# Patient Record
Sex: Male | Born: 1985 | Race: White | Hispanic: No | Marital: Married | Smoking: Never smoker
Health system: Southern US, Community
[De-identification: ages and names within clinical notes are randomized; demographics above are authoritative.]

## PROBLEM LIST (undated history)

## (undated) HISTORY — PX: SKIN SURGERY: SHX2413

## (undated) HISTORY — PX: WISDOM TOOTH EXTRACTION: SHX21

---

## 2020-07-06 ENCOUNTER — Ambulatory Visit (INDEPENDENT_AMBULATORY_CARE_PROVIDER_SITE_OTHER): Payer: No Typology Code available for payment source | Admitting: Internal Medicine

## 2020-07-06 ENCOUNTER — Other Ambulatory Visit: Payer: Self-pay

## 2020-07-06 ENCOUNTER — Encounter: Payer: Self-pay | Admitting: Internal Medicine

## 2020-07-06 ENCOUNTER — Ambulatory Visit (INDEPENDENT_AMBULATORY_CARE_PROVIDER_SITE_OTHER): Payer: No Typology Code available for payment source

## 2020-07-06 VITALS — BP 120/78 | HR 79 | Temp 98.6°F | Ht 70.0 in | Wt 173.0 lb

## 2020-07-06 DIAGNOSIS — M25532 Pain in left wrist: Secondary | ICD-10-CM

## 2020-07-06 DIAGNOSIS — Z0001 Encounter for general adult medical examination with abnormal findings: Secondary | ICD-10-CM | POA: Diagnosis not present

## 2020-07-06 DIAGNOSIS — G8929 Other chronic pain: Secondary | ICD-10-CM | POA: Insufficient documentation

## 2020-07-06 DIAGNOSIS — R0683 Snoring: Secondary | ICD-10-CM | POA: Diagnosis not present

## 2020-07-06 DIAGNOSIS — Z23 Encounter for immunization: Secondary | ICD-10-CM | POA: Diagnosis not present

## 2020-07-06 LAB — CBC WITH DIFFERENTIAL/PLATELET
Basophils Absolute: 0 10*3/uL (ref 0.0–0.1)
Basophils Relative: 0.5 % (ref 0.0–3.0)
Eosinophils Absolute: 0.1 10*3/uL (ref 0.0–0.7)
Eosinophils Relative: 1.3 % (ref 0.0–5.0)
HCT: 46.9 % (ref 39.0–52.0)
Hemoglobin: 16.1 g/dL (ref 13.0–17.0)
Lymphocytes Relative: 26.4 % (ref 12.0–46.0)
Lymphs Abs: 1.4 10*3/uL (ref 0.7–4.0)
MCHC: 34.4 g/dL (ref 30.0–36.0)
MCV: 88 fl (ref 78.0–100.0)
Monocytes Absolute: 0.5 10*3/uL (ref 0.1–1.0)
Monocytes Relative: 9.5 % (ref 3.0–12.0)
Neutro Abs: 3.4 10*3/uL (ref 1.4–7.7)
Neutrophils Relative %: 62.3 % (ref 43.0–77.0)
Platelets: 255 10*3/uL (ref 150.0–400.0)
RBC: 5.32 Mil/uL (ref 4.22–5.81)
RDW: 12.8 % (ref 11.5–15.5)
WBC: 5.4 10*3/uL (ref 4.0–10.5)

## 2020-07-06 LAB — C-REACTIVE PROTEIN: CRP: <1 mg/dL (ref 0.5–20.0)

## 2020-07-06 LAB — LIPID PANEL
Cholesterol: 190 mg/dL (ref 0–200)
HDL: 40.6 mg/dL (ref >39.00)
LDL Cholesterol: 130 mg/dL — ABNORMAL HIGH (ref 0–99)
NonHDL: 149.61
Total CHOL/HDL Ratio: 5
Triglycerides: 96 mg/dL (ref 0.0–149.0)
VLDL: 19.2 mg/dL (ref 0.0–40.0)

## 2020-07-06 LAB — BASIC METABOLIC PANEL
BUN: 11 mg/dL (ref 6–23)
CO2: 30 mEq/L (ref 19–32)
Calcium: 9.9 mg/dL (ref 8.4–10.5)
Chloride: 101 mEq/L (ref 96–112)
Creatinine, Ser: 0.86 mg/dL (ref 0.40–1.50)
GFR: 111.93 mL/min (ref >60.00)
Glucose, Bld: 89 mg/dL (ref 70–99)
Potassium: 4.3 mEq/L (ref 3.5–5.1)
Sodium: 138 mEq/L (ref 135–145)

## 2020-07-06 LAB — TSH: TSH: 2.02 u[IU]/mL (ref 0.35–4.50)

## 2020-07-06 NOTE — Patient Instructions (Signed)

## 2020-07-06 NOTE — Progress Notes (Signed)
Subjective:  Patient ID: Hector Evans, male    DOB: Feb 26, 1986  Age: 35 y.o. MRN: [id number]  CC: Annual Exam  This visit occurred during the SARS-CoV-2 public health emergency.  Safety protocols were in place, including screening questions prior to the visit, additional usage of staff PPE, and extensive cleaning of exam room while observing appropriate contact time as indicated for disinfecting solutions.    HPI Hector Evans presents for a CPX and to establish.  Hector Evans complains of left wrist pain for greater than 6 months.  Hector Evans denies any specific trauma or injury.  Hector Evans has not had any swelling or redness over the wrist.  Hector Evans describes discomfort when Hector Evans extends at the wrist.  Hector Evans has not taken any medications for this.  His wife complains about his snoring.  No outpatient medications prior to visit.   No facility-administered medications prior to visit.   History reviewed. No pertinent past medical history. Past Surgical History:  Procedure Laterality Date  . WISDOM TOOTH EXTRACTION      reports that Hector Evans has never smoked. Hector Evans has never used smokeless tobacco. Hector Evans reports current alcohol use of about 8.0 standard drinks of alcohol per week. Hector Evans reports that Hector Evans does not use drugs. family history includes Cancer in his father; Other in his mother. Allergies  Allergen Reactions  . Amoxicillin     ROS Review of Systems  Constitutional: Negative.  Negative for appetite change, chills, diaphoresis, fatigue and fever.  HENT: Negative.   Eyes: Negative.   Respiratory: Negative for cough, chest tightness, shortness of breath and wheezing.   Cardiovascular: Negative for chest pain, palpitations and leg swelling.  Gastrointestinal: Negative for abdominal pain, constipation, diarrhea, nausea and vomiting.  Endocrine: Negative.   Genitourinary: Negative.  Negative for difficulty urinating, dysuria, penile swelling, scrotal swelling and testicular pain.  Musculoskeletal: Positive for arthralgias.  Negative for myalgias and neck pain.  Skin: Negative for color change and pallor.  Neurological: Negative.  Negative for dizziness, weakness and light-headedness.  Hematological: Negative for adenopathy. Does not bruise/bleed easily.  Psychiatric/Behavioral: Negative.     Objective:  BP 120/78 (BP Location: Right Arm, Patient Position: Sitting, Cuff Size: Large)   Pulse 79   Temp 98.6 F (37 C) (Oral)   Ht 5\' 10"  (1.778 m)   Wt 173 lb (78.5 kg)   SpO2 97%   BMI 24.82 kg/m   BP Readings from Last 3 Encounters:  07/06/20 120/78    Wt Readings from Last 3 Encounters:  07/06/20 173 lb (78.5 kg)    Physical Exam Vitals reviewed.  Constitutional:      Appearance: Normal appearance.  HENT:     Nose: Nose normal.     Mouth/Throat:     Mouth: Mucous membranes are moist.  Eyes:     General: No scleral icterus.    Conjunctiva/sclera: Conjunctivae normal.  Cardiovascular:     Rate and Rhythm: Normal rate and regular rhythm.     Heart sounds: Normal heart sounds, S1 normal and S2 normal. No murmur heard. No gallop.      Comments: EKG- Sinus bradycardia, 58 bpm LAFB  No LVH or Q waves No old EKG's for comparsion Pulmonary:     Effort: Pulmonary effort is normal.     Breath sounds: No stridor. No wheezing, rhonchi or rales.  Abdominal:     General: Abdomen is flat.     Palpations: There is no mass.     Tenderness: There is no abdominal tenderness.  Musculoskeletal:     Right wrist: Normal.     Left wrist: Normal. No swelling, deformity, effusion, tenderness, bony tenderness or snuff box tenderness. Normal range of motion.     Cervical back: Neck supple.     Right lower leg: No edema.     Left lower leg: No edema.  Lymphadenopathy:     Cervical: No cervical adenopathy.  Skin:    General: Skin is warm and dry.     Coloration: Skin is not pale.  Neurological:     General: No focal deficit present.     Mental Status: Hector Evans is alert and oriented to person, place, and time.  Mental status is at baseline.  Psychiatric:        Mood and Affect: Mood normal.        Behavior: Behavior normal.     Lab Results  Component Value Date   WBC 5.4 07/06/2020   HGB 16.1 07/06/2020   HCT 46.9 07/06/2020   PLT 255.0 07/06/2020   GLUCOSE 89 07/06/2020   CHOL 190 07/06/2020   TRIG 96.0 07/06/2020   HDL 40.60 07/06/2020   LDLCALC 130 (H) 07/06/2020   NA 138 07/06/2020   K 4.3 07/06/2020   CL 101 07/06/2020   CREATININE 0.86 07/06/2020   BUN 11 07/06/2020   CO2 30 07/06/2020   TSH 2.02 07/06/2020    DG Wrist Complete Left  Result Date: 07/07/2020 CLINICAL DATA:  Left wrist pain EXAM: LEFT WRIST - COMPLETE 3+ VIEW COMPARISON:  None. FINDINGS: There is no evidence of fracture or dislocation. There is no evidence of arthropathy or other focal bone abnormality. Soft tissues are unremarkable. IMPRESSION: Negative. Electronically Signed   By: Helyn Numbers MD   On: 07/07/2020 03:50    Assessment & Plan:   Karrington was seen today for annual exam.  Diagnoses and all orders for this visit:  Encounter for general adult medical examination with abnormal findings- Exam completed, labs reviewed, vaccines reviewed and updated, no cancer screenings indicated, patient education material was given. -     Lipid panel; Future -     HIV Antibody (routine testing w rflx); Future -     Hepatitis C antibody; Future -     Hepatitis C antibody -     HIV Antibody (routine testing w rflx) -     Lipid panel  Loud snoring- Labs are negative for secondary causes.  I recommended that Hector Evans see sleep medicine to consider undergoing a sleep study. -     CBC with Differential/Platelet; Future -     Basic metabolic panel; Future -     TSH; Future -     Ambulatory referral to Sleep Studies -     TSH -     Basic metabolic panel -     CBC with Differential/Platelet  Chronic pain of left wrist- Exam, labs, and x-ray are reassuring.  I am concerned Hector Evans may have tenosynovitis.  I have asked him  to see sports medicine. -     Basic metabolic panel; Future -     C-reactive protein; Future -     DG Wrist Complete Left; Future -     C-reactive protein -     Basic metabolic panel -     Ambulatory referral to Sports Medicine  Need for Tdap vaccination -     Tdap vaccine greater than or equal to 7yo IM   [facility] does not currently have medications on file.  No orders of the defined types were placed in this encounter.    Follow-up: Return in about 6 months (around 01/05/2021).  Sanda Linger, MD

## 2020-07-07 LAB — HEPATITIS C ANTIBODY
Hepatitis C Ab: NONREACTIVE
SIGNAL TO CUT-OFF: 0.01 (ref <1.00)

## 2020-07-07 LAB — HIV ANTIBODY (ROUTINE TESTING W REFLEX): HIV 1&2 Ab, 4th Generation: NONREACTIVE

## 2020-09-16 ENCOUNTER — Ambulatory Visit (INDEPENDENT_AMBULATORY_CARE_PROVIDER_SITE_OTHER): Payer: No Typology Code available for payment source | Admitting: Neurology

## 2020-09-16 ENCOUNTER — Encounter: Payer: Self-pay | Admitting: Neurology

## 2020-09-16 VITALS — BP 122/72 | HR 72 | Ht 70.0 in | Wt 174.0 lb

## 2020-09-16 DIAGNOSIS — R0683 Snoring: Secondary | ICD-10-CM | POA: Diagnosis not present

## 2020-09-16 DIAGNOSIS — F519 Sleep disorder not due to a substance or known physiological condition, unspecified: Secondary | ICD-10-CM | POA: Diagnosis not present

## 2020-09-16 NOTE — Progress Notes (Signed)
SLEEP MEDICINE CLINIC    Provider:  Melvyn Novas, MD  Primary Care Physician:  Hector Grandchild, MD [address] [place] [address]     Referring Provider: Etta Grandchild, Md [address] [place],  [place] [address]          Chief Complaint according to patient   Patient presents with:     New Patient (Initial Visit)           HISTORY OF PRESENT ILLNESS:  Hector Evans is a 35 -year old Caucasian male patient and seen here upon referral on 09/16/2020 from his PCP  for a sleep consultation. .  Chief concern according to patient :  " I never fall asleep unintentionally, but wake with mouth and nasal dryness, and I snore" Hector Evans  has no past medical history on file. In the EPIC system.       Sleep relevant medical history: Often woken by his son (42 years old)  at night, but without external stimuli he sleeps well through the night. Sinusitis.    Family medical /sleep history:  other family member on CPAP with OSA, insomnia, sleep walkers.    Social history:  Patient is working as Database administrator.   and lives in a household with wife and toddler.  Pets are not present. Tobacco use never .  ETOH use 6-8 week, Caffeine intake in form of Coffee( in AM 2 -3) Soda( /) Tea ( /) or energy drinks. Regular exercise - none.         Sleep habits are as follows: The patient's dinner time is between 6-7 PM. The patient goes to bed at 11 PM and continues to sleep for 6-8 hours, . The preferred sleep position is supine , with the support of 1 pillow.  Dreams are reportedly frequent/vivid.  7  AM is the usual rise time. The patient wakes up with an alarm. He feels not having slept enough.  He reports not feeling refreshed or restored in AM, with symptoms such as dry mouth, Naps are taken infrequently.  Sleep changed a bot with the birth of his child and that coincides with onset of snoring. He has gained weight since his birth, 20 pounds. .     Review of  Systems: Out of a complete 14 system review, the patient complains of only the following symptoms, and all other reviewed systems are negative.:  Fatigue, sleepiness , snoring,   How likely are you to doze in the following situations: 0 = not likely, 1 = slight chance, 2 = moderate chance, 3 = high chance   Sitting and Reading? Watching Television? Sitting inactive in a public place (theater or meeting)? As a passenger in a car for an hour without a break? Lying down in the afternoon when circumstances permit? Sitting and talking to someone? Sitting quietly after lunch without alcohol? In a car, while stopped for a few minutes in traffic?   Total = 7 / 24 points   FSS endorsed at 34/ 63 points.   Social History   Socioeconomic History   Marital status: Married    Spouse name: Hector Evans   Number of children: 1   Years of education: Not on file   Highest education level: Bachelor's degree (e.g., BA, AB, BS)  Occupational History   Not on file  Tobacco Use   Smoking status: Never   Smokeless tobacco: Never  Substance and Sexual Activity   Alcohol use: Yes  Alcohol/week: 8.0 standard drinks    Types: 8 Cans of beer per week   Drug use: Never   Sexual activity: Yes    Partners: Female    Birth control/protection: None  Other Topics Concern   Not on file  Social History Narrative   Lives with wife and son   Caffeine: 2 strong cups a day   Right handed   Social Determinants of Health   Financial Resource Strain: Not on file  Food Insecurity: Not on file  Transportation Needs: Not on file  Physical Activity: Not on file  Stress: Not on file  Social Connections: Not on file    Family History  Problem Relation Age of Onset   Other Mother        brain tumor   Cancer Father        lymphoma   Heart disease Maternal Grandmother    Heart disease Maternal Grandfather    Heart disease Paternal Grandmother     History reviewed. No pertinent past medical  history.  Past Surgical History:  Procedure Laterality Date   SKIN SURGERY     2022   WISDOM TOOTH EXTRACTION       No current outpatient medications on file prior to visit.   No current facility-administered medications on file prior to visit.    Allergies  Allergen Reactions   Amoxicillin     Physical exam:  Today's Vitals   09/16/20 1520  BP: 122/72  Pulse: 72  Weight: 174 lb (78.9 kg)  Height: 5\' 10"  (1.778 m)   Body mass index is 24.97 kg/m.   Wt Readings from Last 3 Encounters:  09/16/20 174 lb (78.9 kg)  07/06/20 173 lb (78.5 kg)     Ht Readings from Last 3 Encounters:  09/16/20 5\' 10"  (1.778 m)  07/06/20 5\' 10"  (1.778 m)      General: The patient is awake, alert and appears not in acute distress. The patient is well groomed. Head: Normocephalic, atraumatic. Neck is supple. Mallampati: 2 ,  neck circumference:17 inches . Nasal airflow patent.  Retrognathia is not  seen.  Dental status: intact  Cardiovascular:  Regular rate and cardiac rhythm by pulse,  without distended neck veins. Respiratory: Lungs are clear to auscultation.  Skin:  Without evidence of ankle edema, or rash. Trunk: The patient's posture is erect.   Neurologic exam : The patient is awake and alert, oriented to place and time.   Memory subjective described as intact.  Attention span & concentration ability appears normal.  Speech is fluent,  without  dysarthria, dysphonia or aphasia.  Mood and affect are appropriate.   Cranial nerves: no loss of smell or taste reported  Pupils are equal and briskly reactive to light. Funduscopic exam .  Extraocular movements in vertical and horizontal planes were intact and without nystagmus. No Diplopia. Visual fields by finger perimetry are intact. Hearing was intact to soft voice and finger rubbing.    Facial sensation intact to fine touch.  Facial motor strength is symmetric and tongue and uvula move midline.  Neck ROM : rotation, tilt and  flexion extension were normal for age and shoulder shrug was symmetrical.    Motor exam:  Symmetric bulk, tone and ROM.   Normal tone without cog -wheeling, symmetric grip strength .   Sensory:  Fine touch and vibration were  normal.  Proprioception tested in the upper extremities was normal.   Coordination: Rapid alternating movements in the fingers/hands were of normal speed.  The Finger-to-nose maneuver was intact without evidence of ataxia, dysmetria or tremor.   Gait and station: Patient could rise unassisted from a seated position, walked without assistive device.  Stance is of normal width/ base .  Toe and heel walk were deferred.  Deep tendon reflexes: in the  upper and lower extremities are symmetric and intact.  Babinski response was deferred .       After spending a total time of  45  minutes face to face and additional time for physical and neurologic examination, review of laboratory studies,  personal review of imaging studies, reports and results of other testing and review of referral information / records as far as provided in visit, I have established the following assessments:  1) loud snoring after some weight gain, in a patient with now BI of 24.99 2) sleep choking when supine.  3) no restorative sleep    My Plan is to proceed with:  1)HST - [employer].   I would like to thank Hector Grandchild, MD and Hector Grandchild, Md [address] [place],  [place] [address] for allowing me to meet with and to take care of this pleasant patient.   I plan to follow up either personally or through our NP within 2-4 month.   CC: I will share my notes with PCP.   Electronically signed by: Hector Novas, MD 09/16/2020 3:28 PM  [facility] Board certified by The [employer] of Sleep Medicine and Diplomate of the [employer] of Sleep Medicine. Board certified In Neurology through the ABPN, Fellow of the [employer] of  Neurology. Medical Director of [facility].

## 2020-09-16 NOTE — Patient Instructions (Signed)
Screening for Sleep Apnea Sleep apnea is a condition in which breathing pauses or becomes shallow during sleep. Sleep apnea screening is a test to determine if you are at risk for sleep apnea. The test includes a series of questions. It will only takes a few minutes. Your health care provider may ask you to have this test in preparation for surgery or as part of a physical exam. What are the symptoms of sleep apnea? Common symptoms of sleep apnea include: Snoring. Waking up often at night. Daytime sleepiness. Pauses in breathing. Choking or gasping during sleep. Irritability. Forgetfulness. Trouble thinking clearly. Depression. Personality changes. Most people with sleep apnea do not know that they have it. What are the advantages of sleep apnea screening? Getting screened for sleep apnea can help: Ensure your safety. It is important for your health care providers to know whether or not you have sleep apnea, especially if you are having surgery or have other long-term (chronic) health conditions. Improve your health and allow you to get a better night's rest. Restful sleep can help you: Have more energy. Lose weight. Improve high blood pressure. Improve diabetes management. Prevent stroke. Prevent car accidents. What happens during the screening? Screening usually includes being asked a list of questions about your sleep quality. Some questions you may be asked include: Do you snore? Is your sleep restless? Do you have daytime sleepiness? Has a partner or spouse told you that you stop breathing during sleep? Have you had trouble concentrating or memory loss? What is your age? What is your neck circumference? To measure your neck, keep your back straight and gently wrap the tape measure around your neck. Put the tape measure at the middle of your neck, between your chin and collarbone. What is your sex assigned at birth? Do you have or are you being treated for high blood  pressure? If your screening test is positive, you are at risk for the condition. Further testing may be needed to confirm a diagnosis of sleep apnea. Where to find more information You can find screening tools online or at your health care clinic. For more information about sleep apnea screening and healthy sleep, visit these websites: Centers for Disease Control and Prevention: www.cdc.gov American Sleep Apnea Association: www.sleepapnea.org Contact a health care provider if: You think that you may have sleep apnea. Summary Sleep apnea screening can help determine if you are at risk for sleep apnea. It is important for your health care providers to know whether or not you have sleep apnea, especially if you are having surgery or have other chronic health conditions. You may be asked to take a screening test for sleep apnea in preparation for surgery or as part of a physical exam. This information is not intended to replace advice given to you by your health care provider. Make sure you discuss any questions you have with your health care provider. Document Revised: 02/06/2020 Document Reviewed: 02/06/2020 Elsevier Patient Education  2022 Elsevier Inc.  

## 2020-10-28 ENCOUNTER — Telehealth: Payer: Self-pay

## 2020-10-28 NOTE — Telephone Encounter (Signed)
LVM for pt to call me back to schedule sleep study  

## 2020-11-01 ENCOUNTER — Telehealth: Payer: Self-pay

## 2020-11-01 NOTE — Telephone Encounter (Signed)
LVM for pt to call me back to schedule sleep study  

## 2020-12-06 ENCOUNTER — Ambulatory Visit (INDEPENDENT_AMBULATORY_CARE_PROVIDER_SITE_OTHER): Payer: No Typology Code available for payment source | Admitting: Neurology

## 2020-12-06 DIAGNOSIS — R0683 Snoring: Secondary | ICD-10-CM

## 2020-12-06 DIAGNOSIS — G4733 Obstructive sleep apnea (adult) (pediatric): Secondary | ICD-10-CM

## 2020-12-06 DIAGNOSIS — F519 Sleep disorder not due to a substance or known physiological condition, unspecified: Secondary | ICD-10-CM

## 2020-12-06 DIAGNOSIS — G478 Other sleep disorders: Secondary | ICD-10-CM

## 2020-12-09 NOTE — Progress Notes (Signed)
[facility]   HOME SLEEP TEST REPORT ( by Watch PAT)   STUDY DATA LOAD:  12-09-2020 DOB:  09/14/1985    ORDERING CLINICIAN: Carmen Dohmeier, MD  REFERRING CLINICIAN: Thomas Jones, MD    CLINICAL INFORMATION/HISTORY: Hector Evans is a 35 -year old male patient seen upon referral on 09/16/2020 from his PCP for a sleep consultation. .  Chief concern according to patient :  " I never fall asleep unintentionally, but wake with mouth and nasal dryness, and I snore"     Epworth sleepiness score:7 /24.   BMI: 24.9 kg/m   Neck Circumference: 17"   FINDINGS:   Sleep Summary:   Total Recording Time (hours, Hector): The total recording time for this home sleep test amounted to 7 hours and 31 minutes of which 6 hours and 50 minutes were calculated total sleep time.   REM sleep proportion was 27.8%.                                 Respiratory Indices:   Calculated pAHI (per hour):   The total apnea hypopnea index per hour was 5.7, the total respiratory disturbance index was 13.1/h.  REM sleep AHI was 12.2/h and non-REM sleep AHI 3.2/h.  I would like to add that the REM sleep RDI or respiratory disturbance index was 26.5/h so there is a clear REM sleep dependency for this otherwise mild apnea.  Supine sleep AHI was 7.2/h and nonsupine sleep AHI 4.4/h.  The highest AHI was measured in prone sleep at 13.4/h.  Snoring level was considered loud at the mean volume of 41 dB and accompanied 20% of total sleep time.                                                                  Oxygen Saturation Statistics:   O2 Saturation Range (%): Between a nadir of 84% and a maximum of 98% oxygen saturation the mean saturation was 95%.  There was no clinically significant time spent in oxygen desaturation.                                      O2 Saturation (minutes) <89%: 1 minute the equivalent of 1.2% of sleep time.          Pulse Rate Statistics:              Pulse Range:    Varied between 44 bpm  and a maximum 118 bpm with a mean heart rate of 66 bpm.  Please note that this home sleep test can only determine heart rate not heart rhythm.             IMPRESSION:  This HST confirms the presence of a very mild sleep apnea but also of snoring.  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  This offers several options of treatment.    RECOMMENDATION: Since the patient has likely an upper airway resistance problem associated with loud snoring and rather mild apnea he could use a dental device in combination with avoiding prone sleep.    with avoiding prone sleep.  He should also avoid sleeping in supine.  This alone should reduce the apnea index to a level below 5 an hour which is considered physiologically normal.  Another option is treating by CPAP which will cover apnea and snoring as well but requires a patent nasal air passage. Auto titration could be used in a pressure between 6 and 16 cmH2O pressure was 3 cm EPR, heated humidification and a mask of patient's choice.  This could be a nasal pillow in association with a chinstrap it could also be a full facemask.  I would like the patient to make a decision which treatment he would prefer.  Of course he could also meet with me or one of our nurse practitioners to discuss treatment options.    INTERPRETING PHYSICIAN:   Melvyn Novas, MD   Medical Director of [facility].

## 2020-12-24 ENCOUNTER — Telehealth: Payer: Self-pay | Admitting: Neurology

## 2020-12-24 ENCOUNTER — Other Ambulatory Visit: Payer: Self-pay | Admitting: Neurology

## 2020-12-24 DIAGNOSIS — G478 Other sleep disorders: Secondary | ICD-10-CM | POA: Insufficient documentation

## 2020-12-24 DIAGNOSIS — R0683 Snoring: Secondary | ICD-10-CM

## 2020-12-24 DIAGNOSIS — F519 Sleep disorder not due to a substance or known physiological condition, unspecified: Secondary | ICD-10-CM | POA: Insufficient documentation

## 2020-12-24 DIAGNOSIS — G4733 Obstructive sleep apnea (adult) (pediatric): Secondary | ICD-10-CM | POA: Insufficient documentation

## 2020-12-24 NOTE — Procedures (Signed)
[facility]   HOME SLEEP TEST REPORT ( by Watch PAT)   STUDY DATA LOAD:  12-09-2020 DOB:  06-02-1985    ORDERING CLINICIAN: Melvyn Novas, MD  REFERRING CLINICIAN: Sanda Linger, MD    CLINICAL INFORMATION/HISTORY: Hector Evans is a 35 -year old male patient seen upon referral on 09/16/2020 from his PCP for a sleep consultation. .  Chief concern according to patient :  " I never fall asleep unintentionally, but wake with mouth and nasal dryness, and I snore"     Epworth sleepiness score:7 /24.   BMI: 24.9 kg/m   Neck Circumference: 17"   FINDINGS:   Sleep Summary:   Total Recording Time (hours, min): The total recording time for this home sleep test amounted to 7 hours and 31 minutes of which 6 hours and 50 minutes were calculated total sleep time.   REM sleep proportion was 27.8%.                                 Respiratory Indices:   Calculated pAHI (per hour):   The total apnea hypopnea index per hour was 5.7, the total respiratory disturbance index was 13.1/h.  REM sleep AHI was 12.2/h and non-REM sleep AHI 3.2/h.  I would like to add that the REM sleep RDI or respiratory disturbance index was 26.5/h so there is a clear REM sleep dependency for this otherwise mild apnea.  Supine sleep AHI was 7.2/h and nonsupine sleep AHI 4.4/h.  The highest AHI was measured in prone sleep at 13.4/h.  Snoring level was considered loud at the mean volume of 41 dB and accompanied 20% of total sleep time.                                                                  Oxygen Saturation Statistics:   O2 Saturation Range (%): Between a nadir of 84% and a maximum of 98% oxygen saturation the mean saturation was 95%.  There was no clinically significant time spent in oxygen desaturation.                                      O2 Saturation (minutes) <89%: 1 minute the equivalent of 1.2% of sleep time.          Pulse Rate Statistics:              Pulse Range:    Varied between 44 bpm  and a maximum 118 bpm with a mean heart rate of 66 bpm.  Please note that this home sleep test can only determine heart rate not heart rhythm.             IMPRESSION:  This HST confirms the presence of a very mild sleep apnea but also of snoring.  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  This offers several options of treatment.    RECOMMENDATION: Since the patient has likely an upper airway resistance problem associated with loud snoring and rather mild apnea he could use a dental device in combination with avoiding prone sleep.  He should also avoid sleeping in supine.  This alone should reduce the apnea index to a level below 5 an hour which is considered physiologically normal.  Another option is treating by CPAP which will cover apnea and snoring as well but requires a patent nasal air passage. Auto titration could be used in a pressure between 6 and 16 cmH2O pressure was 3 cm EPR, heated humidification and a mask of patient's choice.  This could be a nasal pillow in association with a chinstrap it could also be a full facemask.  I would like the patient to make a decision which treatment he would prefer.  Of course he could also meet with me or one of our nurse practitioners to discuss treatment options.    INTERPRETING PHYSICIAN:   Melvyn Novas, MD   Medical Director of [facility] at [facility].

## 2020-12-24 NOTE — Telephone Encounter (Signed)
-----   Message from Melvyn Novas, MD sent at 12/24/2020 10:25 AM EDT ----- IMPRESSION:  This HST confirms the presence of a very mild sleep apnea but also of snoring.  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  This offers several options of treatment.   RECOMMENDATION: Since the patient has likely an upper airway resistance problem associated with loud snoring and rather mild apnea, he could use a dental device in combination with avoiding prone sleep.   He should also avoid sleeping in supine.  This alone should reduce the apnea index to a level below 5 an hour which is considered physiologically normal.   Another option is treating by CPAP which will treat apnea and snoring as well ,but requires a patent nasal air passage. Auto titration CPAP can be used with a pressure setting between 6 and 16 cmH2O pressure , 3 cm water EPR, and heated humidification ( to keep airways moist) with an interface/ mask of patient's choice.    This could be a nasal pillow in association with a chinstrap, it could also be a full facemask.   I would like the patient to make a decision which treatment he would prefer.  Of course he could also meet with me or one of our nurse practitioners to discuss treatment options.

## 2020-12-24 NOTE — Telephone Encounter (Signed)
I called pt. I advised pt that Dr. Vickey Huger reviewed their sleep study results and found that pt has mild osa. Dr. Vickey Huger recommends that pt starts auto CPAP. I reviewed PAP compliance expectations with the pt. Pt is agreeable to starting a CPAP. I advised pt that an order will be sent to a DME, [facility], and [facility] will call the pt within about one week after they file with the pt's insurance. [facility] will show the pt how to use the machine, fit for masks, and troubleshoot the CPAP if needed. A follow up appt was made for insurance purposes with Dr. Vickey Huger on Jan 19,2022 at 10:30 am. Pt verbalized understanding to arrive 15 minutes early and bring their CPAP. A letter with all of this information in it will be mailed to the pt as a reminder. I verified with the pt that the address we have on file is correct. Pt verbalized understanding of results. Pt had no questions at this time but was encouraged to call back if questions arise. I have sent the order to [facility] and have received confirmation that they have received the order.

## 2020-12-24 NOTE — Progress Notes (Signed)
IMPRESSION:  This HST confirms the presence of a very mild sleep apnea but also of snoring.  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  This offers several options of treatment.   RECOMMENDATION: Since the patient has likely an upper airway resistance problem associated with loud snoring and rather mild apnea, he could use a dental device in combination with avoiding prone sleep.   He should also avoid sleeping in supine.  This alone should reduce the apnea index to a level below 5 an hour which is considered physiologically normal.   Another option is treating by CPAP which will treat apnea and snoring as well ,but requires a patent nasal air passage. Auto titration CPAP can be used with a pressure setting between 6 and 16 cmH2O pressure , 3 cm water EPR, and heated humidification ( to keep airways moist) with an interface/ mask of patient's choice.    This could be a nasal pillow in association with a chinstrap, it could also be a full facemask.   I would like the patient to make a decision which treatment he would prefer.  Of course he could also meet with me or one of our nurse practitioners to discuss treatment options.

## 2021-03-24 ENCOUNTER — Telehealth: Payer: Self-pay | Admitting: Neurology

## 2021-03-24 NOTE — Telephone Encounter (Signed)
Called pt back. He has not heard from [facility] since the initial call from them back in October to get set up w/ CPAP. He has tried reaching out but no one has called him back. Offered to send orders at this point to [place]. He wants [place] to reach out to [facility] first to see if we can get an update. Aware I will do this and call him back.  I sent urgent email to [facility], waiting on response.

## 2021-03-24 NOTE — Telephone Encounter (Signed)
Given the patient has not been set up, I will send everything over to another company. [facility]. Their phone number is [phone]. We will let the pt know that we are sending it there

## 2021-03-24 NOTE — Telephone Encounter (Signed)
Response from [facility]: "It looks like we called him on 12/23 and on 12/27 with no answer LVM. I will email the office to reach out again."

## 2021-03-24 NOTE — Telephone Encounter (Signed)
Called pt back. Relayed Ashley's message. He states he did receive one the messages and tried calling back but never heard anything. Aware they should be reaching out to him again. Aware Casey,RN did send referral to [place] as well to see if they can get him set up quicker. If he hears from [place] before [facility], he will go with [facility]. He will call back if he has any further questions.

## 2021-03-29 NOTE — Progress Notes (Signed)
Subjective:    Patient ID: Hector Evans, male    DOB: 02/06/1986, 36 y.o.   MRN: [id number]  This visit occurred during the SARS-CoV-2 public health emergency.  Safety protocols were in place, including screening questions prior to the visit, additional usage of staff PPE, and extensive cleaning of exam room while observing appropriate contact time as indicated for disinfecting solutions.    HPI The patient is here for an acute visit.   Back pain x 3 years - he thinks it may be from holding his kids incorrectly.  B/l paravertebral pain.   Doing ab exercises seem to help temporarily.  There is no radiation/ n/t in legs.   Sometimes popping the back helps slightly.     He has noticed in the past he has an anterior pelvic tilt.  He sits all day on computer.    Today is a good day and it does not have much pain.  Pain is intermittent.   Medications and allergies reviewed with patient and updated if appropriate.  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   No current facility-administered medications on file prior to visit.    History reviewed. No pertinent past medical history.  Past Surgical History:  Procedure Laterality Date   SKIN SURGERY     2022   WISDOM TOOTH EXTRACTION      Social History   Socioeconomic History   Marital status: Married    Spouse name: racheal   Number of children: 1   Years of education: Not on file   Highest education level: Bachelor's degree (e.g., BA, AB, BS)  Occupational History   Not on file  Tobacco Use   Smoking status: Never   Smokeless tobacco: Never   Substance and Sexual Activity   Alcohol use: Yes    Alcohol/week: 8.0 standard drinks    Types: 8 Cans of beer per week   Drug use: Never   Sexual activity: Yes    Partners: Female    Birth control/protection: None  Other Topics Concern   Not on file  Social History Narrative   Lives with wife and son   Caffeine: 2 strong cups a day   Right handed   Social Determinants of Health   Financial Resource Strain: Not on file  Food Insecurity: Not on file  Transportation Needs: Not on file  Physical Activity: Not on file  Stress: Not on file  Social Connections: Not on file    Family History  Problem Relation Age of Onset   Other Mother        brain tumor   Cancer Father        lymphoma   Heart disease Maternal Grandmother    Heart disease Maternal Grandfather    Heart disease Paternal Grandmother     Review of Systems     Objective:   Vitals:   03/30/21 1302  BP: 110/80  Pulse: 78  Temp: 98.4 F (36.9 C)  SpO2: 96%   BP Readings from Last 3 Encounters:  03/30/21 110/80  09/16/20 122/72  07/06/20 120/78   Wt Readings from Last 3 Encounters:  03/30/21  168 lb (76.2 kg)  09/16/20 174 lb (78.9 kg)  07/06/20 173 lb (78.5 kg)   Body mass index is 24.11 kg/m.   Physical Exam Constitutional:      General: He is not in acute distress.    Appearance: Normal appearance. He is not ill-appearing.  HENT:     Head: Normocephalic and atraumatic.  Musculoskeletal:        General: No tenderness or deformity.     Right lower leg: No edema.     Left lower leg: No edema.  Skin:    General: Skin is warm and dry.  Neurological:     Mental Status: He is alert.     Sensory: No sensory deficit.     Motor: No weakness.           Assessment & Plan:    Chronic, intermittent para-vertebral thoracic back pain: Chronic, intermittent Minimal - no pain today - it is a good day Muscular pain - likely compensating for something - ? Vertebral problem or pelvic  tilt Sits all day, poor posture may be contributing Abd exercise help temporarily Stretching does not help Popping back may help minimally Does not take any medications Will refer to sports med for eval/treatment

## 2021-03-30 ENCOUNTER — Encounter: Payer: Self-pay | Admitting: Internal Medicine

## 2021-03-30 ENCOUNTER — Other Ambulatory Visit: Payer: Self-pay

## 2021-03-30 ENCOUNTER — Ambulatory Visit (INDEPENDENT_AMBULATORY_CARE_PROVIDER_SITE_OTHER): Payer: No Typology Code available for payment source | Admitting: Internal Medicine

## 2021-03-30 VITALS — BP 110/80 | HR 78 | Temp 98.4°F | Ht 70.0 in | Wt 168.0 lb

## 2021-03-30 DIAGNOSIS — M546 Pain in thoracic spine: Secondary | ICD-10-CM | POA: Diagnosis not present

## 2021-03-30 DIAGNOSIS — G8929 Other chronic pain: Secondary | ICD-10-CM | POA: Diagnosis not present

## 2021-03-30 NOTE — Patient Instructions (Addendum)
° ° °  Please schedule an appt with Dr Katrinka Blazing for chronic intermittent thoracic back pain.   A referral was ordered for sports medicine - Dr Katrinka Blazing       Someone from their office will call you to schedule an appointment.

## 2021-03-31 ENCOUNTER — Ambulatory Visit: Payer: No Typology Code available for payment source | Admitting: Neurology

## 2021-04-07 ENCOUNTER — Telehealth: Payer: Self-pay | Admitting: Neurology

## 2021-04-07 NOTE — Progress Notes (Signed)
[facility] [address] [place] [address] Phone: [phone] Subjective:   Hector Evans, am serving as a scribe for Dr. Antoine Evans. This visit occurred during the SARS-CoV-2 public health emergency.  Safety protocols were in place, including screening questions prior to the visit, additional usage of staff PPE, and extensive cleaning of exam room while observing appropriate contact time as indicated for disinfecting solutions.   I'm seeing this patient by the request  of:  Hector Grandchild, MD  CC: Thoracic back pain  YIR:[id number]  Hector Evans is a 36 y.o. male coming in with complaint of thoracic spine pain. Patient states has been in pain for the last 3 years. Dull annoying and inconsistent. No interventions other than abdominal exercises that help. Pain is in lower thoracic area. Patient denies any true radiation down the legs.  He does notice when he is holding the children seems to be a little bit more worse.  Patient does not notice the pain when he is working but does have a job where he does sit significant amount of time.      No past medical history on file. Past Surgical History:  Procedure Laterality Date   SKIN SURGERY     2022   WISDOM TOOTH EXTRACTION     Social History   Socioeconomic History   Marital status: Married    Spouse name: Hector Evans   Number of children: 1   Years of education: Not on file   Highest education level: Bachelor's degree (e.g., BA, AB, BS)  Occupational History   Not on file  Tobacco Use   Smoking status: Never   Smokeless tobacco: Never  Substance and Sexual Activity   Alcohol use: Yes    Alcohol/week: 8.0 standard drinks    Types: 8 Cans of beer per week   Drug use: Never   Sexual activity: Yes    Partners: Female    Birth control/protection: None  Other Topics Concern   Not on file  Social History Narrative   Lives with wife and son   Caffeine: 2 strong cups a day   Right handed    Social Determinants of Health   Financial Resource Strain: Not on file  Food Insecurity: Not on file  Transportation Needs: Not on file  Physical Activity: Not on file  Stress: Not on file  Social Connections: Not on file   Allergies  Allergen Reactions   Amoxicillin    Family History  Problem Relation Age of Onset   Other Mother        brain tumor   Cancer Father        lymphoma   Heart disease Maternal Grandmother    Heart disease Maternal Grandfather    Heart disease Paternal Grandmother    No current outpatient medications on file.   Reviewed prior external information including notes and imaging from  primary care provider As well as notes that were available from care everywhere and other healthcare systems.  Past medical history, social, surgical and family history all reviewed in electronic medical record.  No pertanent information unless stated regarding to the chief complaint.   Review of Systems:  No headache, visual changes, nausea, vomiting, diarrhea, constipation, dizziness, abdominal pain, skin rash, fevers, chills, night sweats, weight loss, swollen lymph nodes, body aches, joint swelling, chest pain, shortness of breath, mood changes. POSITIVE muscle aches  Objective  Blood pressure 122/72, pulse 88, height 5\' 10"  (1.778 m), weight 169  lb (76.7 kg), SpO2 98 %.   General: No apparent distress alert and oriented x3 mood and affect normal, dressed appropriately.  HEENT: Pupils equal, extraocular movements intact  Respiratory: Patient's speak in full sentences and does not appear short of breath  Cardiovascular: No lower extremity edema, non tender, no erythema  Gait normal with good balance and coordination.  MSK: Thoracic spine does have some very mild loss of lordosis.  Patient also has loss of lordosis of the lumbar spine.  Very mild tightness with rotation bilaterally. Negative straight leg test.  5 out of 5 strength of the upper  extremities.  Osteopathic findings C4 flexed rotated and side bent left T3 extended rotated and side bent right inhaled third rib T11 extended rotated and side bent left L1 flexed rotated and side bent right Sacrum right on right     Impression and Recommendations:     The above documentation has been reviewed and is accurate and complete Judi Saa, DO

## 2021-04-07 NOTE — Telephone Encounter (Signed)
Pt has been scheduled for their Initial Cpap visit. Pt was informed to bring machine and power cord to the appt.  To be scheduled between 05/01/21-06/29/21

## 2021-04-08 ENCOUNTER — Other Ambulatory Visit: Payer: Self-pay

## 2021-04-08 ENCOUNTER — Encounter: Payer: Self-pay | Admitting: Family Medicine

## 2021-04-08 ENCOUNTER — Ambulatory Visit (INDEPENDENT_AMBULATORY_CARE_PROVIDER_SITE_OTHER): Payer: No Typology Code available for payment source | Admitting: Family Medicine

## 2021-04-08 DIAGNOSIS — M9902 Segmental and somatic dysfunction of thoracic region: Secondary | ICD-10-CM | POA: Insufficient documentation

## 2021-04-08 DIAGNOSIS — M9903 Segmental and somatic dysfunction of lumbar region: Secondary | ICD-10-CM | POA: Diagnosis not present

## 2021-04-08 DIAGNOSIS — G8929 Other chronic pain: Secondary | ICD-10-CM

## 2021-04-08 DIAGNOSIS — M9904 Segmental and somatic dysfunction of sacral region: Secondary | ICD-10-CM

## 2021-04-08 DIAGNOSIS — M546 Pain in thoracic spine: Secondary | ICD-10-CM | POA: Insufficient documentation

## 2021-04-08 DIAGNOSIS — M9901 Segmental and somatic dysfunction of cervical region: Secondary | ICD-10-CM

## 2021-04-08 DIAGNOSIS — M9908 Segmental and somatic dysfunction of rib cage: Secondary | ICD-10-CM

## 2021-04-08 NOTE — Patient Instructions (Addendum)
Do prescribed exercises at least 3x a week Vit D 2000iu and Tart cherry 1200mg  Keep monitor at eye level See you again in 5-6 weeks

## 2021-04-08 NOTE — Assessment & Plan Note (Signed)

## 2021-04-08 NOTE — Assessment & Plan Note (Signed)
Patient does have thoracic back pain.  Seems to be more ergonomic and muscle imbalances.  Discussed icing regimen and home exercises, discussed which activities to do which wants to avoid.  Increase activity slowly.  Patient given a note for an adjustable standing desk that I think will be beneficial.  Patient does have now 2 small children that likely is also contributing.  Follow-up again in 4 to 6 weeks

## 2021-05-11 ENCOUNTER — Encounter: Payer: Self-pay | Admitting: Adult Health

## 2021-05-11 ENCOUNTER — Ambulatory Visit (INDEPENDENT_AMBULATORY_CARE_PROVIDER_SITE_OTHER): Payer: No Typology Code available for payment source | Admitting: Adult Health

## 2021-05-11 VITALS — BP 136/82 | HR 87 | Ht 70.0 in | Wt 172.0 lb

## 2021-05-11 DIAGNOSIS — Z9989 Dependence on other enabling machines and devices: Secondary | ICD-10-CM | POA: Diagnosis not present

## 2021-05-11 DIAGNOSIS — G4733 Obstructive sleep apnea (adult) (pediatric): Secondary | ICD-10-CM

## 2021-05-11 NOTE — Progress Notes (Signed)
? ? ?PATIENT: Hector Evans ?DOB: 12-Feb-1986 ? ?REASON FOR VISIT: follow up ?HISTORY FROM: patient ?PRIMARY NEUROLOGIST: Dr. Vickey Huger ? ?HISTORY OF PRESENT ILLNESS: ?Today 05/11/21: ? ?Mr. Cofer is a 36 year old male with a history of obstructive sleep apnea on CPAP.  He returns today for his first CPAP download.  His sleep study showed that he had mild sleep apnea.  His download shows that he uses machine 27 out of 30 days for compliance of 90%.  He uses machine on average 7 hours and 48 minutes.  His residual AHI is 1.6 on 6 to 16 cm of water.  The patient states that he does not really like the CPAP.  Reports that the mask leaks when he lays on his back and side.  He would like to try the dental device. ? ? ?REVIEW OF SYSTEMS: Out of a complete 14 system review of symptoms, the patient complains only of the following symptoms, and all other reviewed systems are negative. ? ?FSS ?ESS ? ?ALLERGIES: ?Allergies  ?Allergen Reactions  ? Amoxicillin   ? ? ?HOME MEDICATIONS: ?No outpatient medications prior to visit.  ? ?No facility-administered medications prior to visit.  ? ? ?PAST MEDICAL HISTORY: ?No past medical history on file. ? ?PAST SURGICAL HISTORY: ?Past Surgical History:  ?Procedure Laterality Date  ? SKIN SURGERY    ? 2022  ? WISDOM TOOTH EXTRACTION    ? ? ?FAMILY HISTORY: ?Family History  ?Problem Relation Age of Onset  ? Other Mother   ?     brain tumor  ? Cancer Father   ?     lymphoma  ? Heart disease Maternal Grandmother   ? Heart disease Maternal Grandfather   ? Heart disease Paternal Grandmother   ? ? ?SOCIAL HISTORY: ?Social History  ? ?Socioeconomic History  ? Marital status: Married  ?  Spouse name: racheal  ? Number of children: 1  ? Years of education: Not on file  ? Highest education level: Bachelor's degree (e.g., BA, AB, BS)  ?Occupational History  ? Not on file  ?Tobacco Use  ? Smoking status: Never  ? Smokeless tobacco: Never  ?Substance and Sexual Activity  ? Alcohol use: Yes  ?   Alcohol/week: 8.0 standard drinks  ?  Types: 8 Cans of beer per week  ? Drug use: Never  ? Sexual activity: Yes  ?  Partners: Female  ?  Birth control/protection: None  ?Other Topics Concern  ? Not on file  ?Social History Narrative  ? Lives with wife and son  ? Caffeine: 2 strong cups a day  ? Right handed  ? ?Social Determinants of Health  ? ?Financial Resource Strain: Not on file  ?Food Insecurity: Not on file  ?Transportation Needs: Not on file  ?Physical Activity: Not on file  ?Stress: Not on file  ?Social Connections: Not on file  ?Intimate Partner Violence: Not on file  ? ? ? ? ?PHYSICAL EXAM ? ?Vitals:  ? 05/11/21 1511  ?BP: 136/82  ?Pulse: 87  ?Weight: 172 lb (78 kg)  ?Height: 5\' 10"  (1.778 m)  ? ?Body mass index is 24.68 kg/m?. ? ?Generalized: Well developed, in no acute distress  ?Chest: Lungs clear to auscultation bilaterally ? ?Neurological examination  ?Mentation: Alert oriented to time, place, history taking. Follows all commands speech and language fluent ?Cranial nerve II-XII: Extraocular movements were full, visual field were full on confrontational test Head turning and shoulder shrug  were normal and symmetric. ?Motor: The motor testing reveals 5  over 5 strength of all 4 extremities. Good symmetric motor tone is noted throughout.  ?Sensory: Sensory testing is intact to soft touch on all 4 extremities. No evidence of extinction is noted.  ?Gait and station: Gait is normal.  ? ? ?DIAGNOSTIC DATA (LABS, IMAGING, TESTING) ?- I reviewed patient records, labs, notes, testing and imaging myself where available. ? ?Lab Results  ?Component Value Date  ? WBC 5.4 07/06/2020  ? HGB 16.1 07/06/2020  ? HCT 46.9 07/06/2020  ? MCV 88.0 07/06/2020  ? PLT 255.0 07/06/2020  ? ?   ?Component Value Date/Time  ? NA 138 07/06/2020 0904  ? K 4.3 07/06/2020 0904  ? CL 101 07/06/2020 0904  ? CO2 30 07/06/2020 0904  ? GLUCOSE 89 07/06/2020 0904  ? BUN 11 07/06/2020 0904  ? CREATININE 0.86 07/06/2020 0904  ? CALCIUM 9.9  07/06/2020 0904  ? ?Lab Results  ?Component Value Date  ? CHOL 190 07/06/2020  ? HDL 40.60 07/06/2020  ? LDLCALC 130 (H) 07/06/2020  ? TRIG 96.0 07/06/2020  ? CHOLHDL 5 07/06/2020  ? ? ?Lab Results  ?Component Value Date  ? TSH 2.02 07/06/2020  ? ? ? ? ?ASSESSMENT AND PLAN ?36 y.o. year old male  has no past medical history on file. here with: ? ?OSA on CPAP ? ?- CPAP compliance excellent ?- Good treatment of AHI  ?-Referral placed for dental device if this is not too costly then he will discontinue CPAP use.  Otherwise he will continue CPAP ?-Follow-up depends on if he stays on CPAP ? ? ? ?Butch Penny, MSN, NP-C 05/11/2021, 2:57 PM ?[facility] ?[address], [address] ?[place], [place] [address] ?[phone] ? ? ?

## 2021-05-11 NOTE — Patient Instructions (Signed)
Your Plan: ? ?Referral for dental device ? ? ? ? ?Thank you for coming to see [place] at [facility]. I hope we have been able to provide you high quality care today. ? ?You may receive a patient satisfaction survey over the next few weeks. We would appreciate your feedback and comments so that we may continue to improve ourselves and the health of our patients. ? ?

## 2021-05-16 ENCOUNTER — Telehealth: Payer: Self-pay | Admitting: Adult Health

## 2021-05-16 NOTE — Telephone Encounter (Signed)
Sent to Dr. Katz ph # [phone] ?

## 2021-05-19 NOTE — Progress Notes (Signed)
?Terrilee Files D.O. ?[facility] ?[address] [place] [address] ?Phone: [phone] ?Subjective:   ?I, Hector Evans, am serving as a scribe for Dr. Antoine Primas. ? ? ?This visit occurred during the SARS-CoV-2 public health emergency.  Safety protocols were in place, including screening questions prior to the visit, additional usage of staff PPE, and extensive cleaning of exam room while observing appropriate contact time as indicated for disinfecting solutions.  ?I'm seeing this patient by the request  of:  Etta Grandchild, MD ? ?CC: Neck and back pain follow-up ? ?[id number]  ?Mehtaab Mayeda is a 36 y.o. male coming in with complaint of back and neck pain. OMT 04/08/2021. Patient states that he has lower back pain. Has 67 month old baby that is contributing to his pain. Notes tenderness over spine that he is concerned about that started 6 months ago.  Denies any radiation of the pain.  States that the midline continues to give him discomfort. ? ?Medications patient has been prescribed: None ? ?Taking: ? ? ?  ? ? ? ? ?Reviewed prior external information including notes and imaging from previsou exam, outside providers and external EMR if available.  ? ?As well as notes that were available from care everywhere and other healthcare systems. ? ?Past medical history, social, surgical and family history all reviewed in electronic medical record.  No pertanent information unless stated regarding to the chief complaint.  ? ?No past medical history on file.  ?Allergies  ?Allergen Reactions  ? Amoxicillin   ? ? ? ?Review of Systems: ? No headache, visual changes, nausea, vomiting, diarrhea, constipation, dizziness, abdominal pain, skin rash, fevers, chills, night sweats, weight loss, swollen lymph nodes, body aches, joint swelling, chest pain, shortness of breath, mood changes. POSITIVE muscle aches ? ?Objective  ?Blood pressure 102/62, pulse 86, height 5\' 10"  (1.778 m), weight 170 lb (77.1 kg), SpO2 99  %. ?  ?General: No apparent distress alert and oriented x3 mood and affect normal, dressed appropriately.  ?HEENT: Pupils equal, extraocular movements intact  ?Respiratory: Patient's speak in full sentences and does not appear short of breath  ?Cardiovascular: No lower extremity edema, non tender, no erythema  ?Low back exam does have some loss of lordosis.  Some tenderness to palpation in the paraspinal musculature.  Patient does have some tenderness at the T12-L1 area and does have some midline.  No significant bony abnormality but possibility of spondylolisthesis.  Significant tightness of the hip flexors noted.  Scapular dyskinesis still noted. ? ?Osteopathic findings ? ?C6 flexed rotated and side bent left ?T3 extended rotated and side bent right inhaled rib ?T11 extended rotated and side bent left ?L1 flexed rotated and side bent right ?Sacrum right on right ? ? ? ? ?  ?Assessment and Plan: ? ?Thoracic back pain ?Continues to have some low back pain.  Seems to be more at the thoracolumbar juncture.  We will get x-rays secondary to some mild midline tenderness at the T12-L1 area.  Discussed icing regimen and home exercises.  Discussed which activities to do and which ones to avoid.  Increase activity slowly.  Follow-up with me again in 6 to 8 weeks.  ? ?Nonallopathic problems ? ?Decision today to treat with OMT was based on Physical Exam ? ?After verbal consent patient was treated with HVLA, ME, FPR techniques in cervical, rib, thoracic, lumbar, and sacral  areas ? ?Patient tolerated the procedure well with improvement in symptoms ? ?Patient given exercises, stretches and lifestyle modifications ? ?  See medications in patient instructions if given ? ?Patient will follow up in 4-8 weeks ? ?  ? ?The above documentation has been reviewed and is accurate and complete Judi Saa, DO ? ? ? ?  ? ? Note: This dictation was prepared with Dragon dictation along with smaller phrase technology. Any transcriptional  errors that result from this process are unintentional.    ?  ?  ? ?

## 2021-05-20 ENCOUNTER — Ambulatory Visit (INDEPENDENT_AMBULATORY_CARE_PROVIDER_SITE_OTHER): Payer: No Typology Code available for payment source

## 2021-05-20 ENCOUNTER — Encounter: Payer: Self-pay | Admitting: Family Medicine

## 2021-05-20 ENCOUNTER — Ambulatory Visit (INDEPENDENT_AMBULATORY_CARE_PROVIDER_SITE_OTHER): Payer: No Typology Code available for payment source | Admitting: Family Medicine

## 2021-05-20 ENCOUNTER — Other Ambulatory Visit: Payer: Self-pay

## 2021-05-20 VITALS — BP 102/62 | HR 86 | Ht 70.0 in | Wt 170.0 lb

## 2021-05-20 DIAGNOSIS — M9903 Segmental and somatic dysfunction of lumbar region: Secondary | ICD-10-CM

## 2021-05-20 DIAGNOSIS — M9904 Segmental and somatic dysfunction of sacral region: Secondary | ICD-10-CM

## 2021-05-20 DIAGNOSIS — M9902 Segmental and somatic dysfunction of thoracic region: Secondary | ICD-10-CM | POA: Diagnosis not present

## 2021-05-20 DIAGNOSIS — M546 Pain in thoracic spine: Secondary | ICD-10-CM

## 2021-05-20 DIAGNOSIS — M545 Low back pain, unspecified: Secondary | ICD-10-CM

## 2021-05-20 DIAGNOSIS — M9908 Segmental and somatic dysfunction of rib cage: Secondary | ICD-10-CM

## 2021-05-20 DIAGNOSIS — M9901 Segmental and somatic dysfunction of cervical region: Secondary | ICD-10-CM | POA: Diagnosis not present

## 2021-05-20 DIAGNOSIS — G8929 Other chronic pain: Secondary | ICD-10-CM

## 2021-05-20 NOTE — Assessment & Plan Note (Signed)
Continues to have some low back pain.  Seems to be more at the thoracolumbar juncture.  We will get x-rays secondary to some mild midline tenderness at the T12-L1 area.  Discussed icing regimen and home exercises.  Discussed which activities to do and which ones to avoid.  Increase activity slowly.  Follow-up with me again in 6 to 8 weeks. ?

## 2021-05-20 NOTE — Patient Instructions (Addendum)
Xray on way out ?Scapular exercises ?Keep doing Vit D ?See me in 4-5 weeks ?

## 2021-05-24 NOTE — Progress Notes (Signed)
Ross Ludwig, RN; Dimas Millin ?Thanks for the update!   ? ?  ?Previous Messages ?  ?----- Message -----  ?From: Guy Begin, RN  ?Sent: 05/23/2021  11:34 AM EDT  ?To: [facility]  ?Subject: cancel dc cpap order                          ? ?Hey good morning.  I was out last week, but from 05-11-2021 [facility] NP had this in her note:  Segundo Makela Feb 14, 1986.  Andrey Campanile Rn  ?   ?1. OSA on CPAP  ?   ?- CPAP compliance excellent  ?- Good treatment of AHI  ?-Referral placed for dental device if this is not too costly then he will discontinue CPAP use.  Otherwise he will continue CPAP  ?-Follow-up depends on if he stays on CPAP  ?   ?   ?   ?Butch Penny, MSN, NP-C 05/11/2021, 2:57 PM  ? ?Andrey Campanile RN  ?----- Message -----  ?From: Jari Favre  ?Sent: 05/16/2021   7:52 AM EDT  ?To: Dimas Millin, Guy Begin, RN  ? ?Hey I talked to this patient and he said that you were going to cancel his D/C order so he can keep the machine???  ? ?

## 2021-06-16 NOTE — Progress Notes (Signed)
?Terrilee Files D.O. ?[facility] ?[address] [place] [address] ?Phone: [phone] ?Subjective:   ?I, Wilford Grist, am serving as a scribe for Dr. Antoine Primas. ? ?This visit occurred during the SARS-CoV-2 public health emergency.  Safety protocols were in place, including screening questions prior to the visit, additional usage of staff PPE, and extensive cleaning of exam room while observing appropriate contact time as indicated for disinfecting solutions.  ? ?I'm seeing this patient by the request  of:  Etta Grandchild, MD ? ?CC: Neck and back pain follow-up ? ?[id number]  ?Hector Evans is a 36 y.o. male coming in with complaint of back and neck pain. OMT 05/20/2021. Patient states that he continues to intermittent dully ache in lumbar spine.  Patient states that it still seems to be worse certain times.  Does not have any significant rhyme or reason why this occurs. ? ?Medications patient has been prescribed: None ? ? ? ? ?  ? ? ? ? ?Reviewed prior external information including notes and imaging from previsou exam, outside providers and external EMR if available.  ? ?As well as notes that were available from care everywhere and other healthcare systems. ? ?Past medical history, social, surgical and family history all reviewed in electronic medical record.  No pertanent information unless stated regarding to the chief complaint.  ? ?No past medical history on file.  ?Allergies  ?Allergen Reactions  ? Amoxicillin   ? ? ? ?Review of Systems: ? No headache, visual changes, nausea, vomiting, diarrhea, constipation, dizziness, abdominal pain, skin rash, fevers, chills, night sweats, weight loss, swollen lymph nodes, body aches, joint swelling, chest pain, shortness of breath, mood changes. POSITIVE muscle aches ? ?Objective  ?Blood pressure 110/74, pulse 78, height 5\' 10"  (1.778 m), weight 170 lb (77.1 kg), SpO2 98 %. ?  ?General: No apparent distress alert and oriented x3 mood and affect  normal, dressed appropriately.  ?HEENT: Pupils equal, extraocular movements intact  ?Respiratory: Patient's speak in full sentences and does not appear short of breath  ?Cardiovascular: No lower extremity edema, non tender, no erythema  ?Neck exam does have some mild loss of lordosis.  Some mild tightness noted on the right side of the neck.  Patient does have some tenderness in the area that is greater than usual.  Patient still has tightness more in the thoracolumbar juncture, right greater than left. ? ?Osteopathic findings ? ?C5 flexed rotated and side bent right ?T4 extended rotated and side bent right inhaled rib ?L1 flexed rotated and side bent right ?Sacrum right on right ? ? ? ? ?  ?Assessment and Plan: ? ?Thoracic back pain ?Patient does have some scapular dyskinesis that is mild.  In addition to this though patient also has pain that seems to be more in the thoracolumbar junction today.  Patient did respond extremely well to osteopathic manipulation.  Patient still wants to avoid any type of medications and I do think that this is okay.  Follow-up again in 6 to 8 weeks. ?  ? ?Nonallopathic problems ? ?Decision today to treat with OMT was based on Physical Exam ? ?After verbal consent patient was treated with HVLA, ME, FPR techniques in cervical, rib, thoracic, lumbar, and sacral  areas ? ?Patient tolerated the procedure well with improvement in symptoms ? ?Patient given exercises, stretches and lifestyle modifications ? ?See medications in patient instructions if given ? ?Patient will follow up in 4-8 weeks ? ?  ? ? ?The above documentation has  been reviewed and is accurate and complete Lyndal Pulley, DO ? ? ? ?  ? ? Note: This dictation was prepared with Dragon dictation along with smaller phrase technology. Any transcriptional errors that result from this process are unintentional.    ?  ?  ? ?

## 2021-06-20 ENCOUNTER — Ambulatory Visit (INDEPENDENT_AMBULATORY_CARE_PROVIDER_SITE_OTHER): Payer: No Typology Code available for payment source | Admitting: Family Medicine

## 2021-06-20 ENCOUNTER — Encounter: Payer: Self-pay | Admitting: Family Medicine

## 2021-06-20 VITALS — BP 110/74 | HR 78 | Ht 70.0 in | Wt 170.0 lb

## 2021-06-20 DIAGNOSIS — M546 Pain in thoracic spine: Secondary | ICD-10-CM | POA: Diagnosis not present

## 2021-06-20 DIAGNOSIS — M9903 Segmental and somatic dysfunction of lumbar region: Secondary | ICD-10-CM | POA: Diagnosis not present

## 2021-06-20 DIAGNOSIS — G8929 Other chronic pain: Secondary | ICD-10-CM

## 2021-06-20 DIAGNOSIS — M9901 Segmental and somatic dysfunction of cervical region: Secondary | ICD-10-CM | POA: Diagnosis not present

## 2021-06-20 DIAGNOSIS — M9902 Segmental and somatic dysfunction of thoracic region: Secondary | ICD-10-CM

## 2021-06-20 DIAGNOSIS — M9908 Segmental and somatic dysfunction of rib cage: Secondary | ICD-10-CM | POA: Diagnosis not present

## 2021-06-20 DIAGNOSIS — M9904 Segmental and somatic dysfunction of sacral region: Secondary | ICD-10-CM | POA: Diagnosis not present

## 2021-06-20 NOTE — Assessment & Plan Note (Signed)
Patient does have some scapular dyskinesis that is mild.  In addition to this though patient also has pain that seems to be more in the thoracolumbar junction today.  Patient did respond extremely well to osteopathic manipulation.  Patient still wants to avoid any type of medications and I do think that this is okay.  Follow-up again in 6 to 8 weeks. ?

## 2021-06-20 NOTE — Patient Instructions (Signed)
Good to see you ?Pick 2 exercises a day to do ?See me again in 6-8 weeks ?

## 2021-07-18 ENCOUNTER — Telehealth: Payer: Self-pay | Admitting: Adult Health

## 2021-07-18 NOTE — Telephone Encounter (Signed)
Pt is asking for a call to discuss another sleep study with his CPAP to confirm that the CPAP is working, please call. ?

## 2021-07-19 NOTE — Telephone Encounter (Signed)
CPAP download looks excellent.  His last sleep study was in 2022 does not need a repeat sleep study however download looks good ?

## 2021-07-19 NOTE — Telephone Encounter (Signed)
Attempted to call pt back but his VM was full.  ?

## 2021-07-19 NOTE — Telephone Encounter (Signed)
Pt returned call.  I relayed that the DL pulled form his machine is excellent.  He states what he wants to validate that the cpap is working when he is on his back.  He had testing based HST that relayed that he slept on his back and mild OSA was diagnosed.  He side sleeps now majorically, but would like to validate that when he sleeps on his back that it is helping.  He stated that the HST was able to state what position he was in.  I relayed that I would ask and then get back to him.  ?

## 2021-07-20 NOTE — Telephone Encounter (Signed)
The CPAP machine does not recall what position he is laying in.  Not sure how I can validate this information ?

## 2021-08-09 NOTE — Progress Notes (Unsigned)
[name] [address] [place] [address] Phone: [phone] Subjective:   Hector Evans, am serving as a scribe for Dr. Antoine Primas.   I'm seeing this patient by the request  of:  Etta Grandchild, MD  CC: Back and neck pain follow-up  [id number]  Hector Evans is a 36 y.o. male coming in with complaint of back and neck pain. OMT 06/20/2021. Patient states that he is doing well. Is working on core which is helpful.  Patient states as long as he continues to work on the posture he seems to do better.  Medications patient has been prescribed: None  Taking:       Reviewed prior external information including notes and imaging from previsou exam, outside providers and external EMR if available.   As well as notes that were available from care everywhere and other healthcare systems.  Past medical history, social, surgical and family history all reviewed in electronic medical record.  No pertanent information unless stated regarding to the chief complaint.   No past medical history on file.  Allergies  Allergen Reactions   Amoxicillin      Review of Systems:  No headache, visual changes, nausea, vomiting, diarrhea, constipation, dizziness, abdominal pain, skin rash, fevers, chills, night sweats, weight loss, swollen lymph nodes, body aches, joint swelling, chest pain, shortness of breath, mood changes. POSITIVE muscle aches  Objective  Blood pressure 112/76, pulse 81, height 5\' 10"  (1.778 m), weight 175 lb (79.4 kg), SpO2 97 %.   General: No apparent distress alert and oriented x3 mood and affect normal, dressed appropriately.  HEENT: Pupils equal, extraocular movements intact  Respiratory: Patient's speak in full sentences and does not appear short of breath  Cardiovascular: No lower extremity edema, non tender, no erythema  Neck exam does have some very mild tightness noted on the paraspinal musculature right greater than left.   Does have still the parascapular region and does seem to have more of an anterior displacement of the shoulder noted.  Patient does have difficulty and tightness noted with any type of retraction of the shoulder.  Osteopathic findings  C2 flexed rotated and side bent right C6 flexed rotated and side bent left T3 extended rotated and side bent right inhaled rib T7 extended rotated and side bent left L2 flexed rotated and side bent right Sacrum right on right       Assessment and Plan:  Thoracic back pain Still believe the patient does have more difficulty with more posture and ergonomics throughout the day.  We discussed different ergonomic changes today.  Still responding relatively well though to osteopathic manipulation.  Discussed posture and ergonomics.  Discussed which activities to do and which ones to potentially avoid.  Increase activity slowly.  Follow-up with me again in 2 to 3 months.   Nonallopathic problems  Decision today to treat with OMT was based on Physical Exam  After verbal consent patient was treated with HVLA, ME, FPR techniques in cervical, rib, thoracic, lumbar, and sacral  areas  Patient tolerated the procedure well with improvement in symptoms  Patient given exercises, stretches and lifestyle modifications  See medications in patient instructions if given  Patient will follow up in 4-8 weeks      The above documentation has been reviewed and is accurate and complete , DO        Note: This dictation was prepared with Dragon dictation along with smaller phrase technology. Any transcriptional  errors that result from this process are unintentional.

## 2021-08-09 NOTE — Telephone Encounter (Signed)
He is on autoset with pressure 6-16. Avg pressure he uses is 7. His machine has the ability to go all the way to 16 but the highest avg it goes is 7. Increases it will not change anything.if needed we can get him an appointment in the office to discuss further

## 2021-08-10 ENCOUNTER — Ambulatory Visit (INDEPENDENT_AMBULATORY_CARE_PROVIDER_SITE_OTHER): Payer: No Typology Code available for payment source | Admitting: Family Medicine

## 2021-08-10 ENCOUNTER — Encounter: Payer: Self-pay | Admitting: Family Medicine

## 2021-08-10 VITALS — BP 112/76 | HR 81 | Ht 70.0 in | Wt 175.0 lb

## 2021-08-10 DIAGNOSIS — M546 Pain in thoracic spine: Secondary | ICD-10-CM

## 2021-08-10 DIAGNOSIS — M9902 Segmental and somatic dysfunction of thoracic region: Secondary | ICD-10-CM | POA: Diagnosis not present

## 2021-08-10 DIAGNOSIS — M9903 Segmental and somatic dysfunction of lumbar region: Secondary | ICD-10-CM | POA: Diagnosis not present

## 2021-08-10 DIAGNOSIS — M9908 Segmental and somatic dysfunction of rib cage: Secondary | ICD-10-CM | POA: Diagnosis not present

## 2021-08-10 DIAGNOSIS — M9901 Segmental and somatic dysfunction of cervical region: Secondary | ICD-10-CM

## 2021-08-10 DIAGNOSIS — M9904 Segmental and somatic dysfunction of sacral region: Secondary | ICD-10-CM

## 2021-08-10 DIAGNOSIS — G8929 Other chronic pain: Secondary | ICD-10-CM

## 2021-08-10 NOTE — Assessment & Plan Note (Signed)
Still believe the patient does have more difficulty with more posture and ergonomics throughout the day.  We discussed different ergonomic changes today.  Still responding relatively well though to osteopathic manipulation.  Discussed posture and ergonomics.  Discussed which activities to do and which ones to potentially avoid.  Increase activity slowly.  Follow-up with me again in 2 to 3 months.

## 2021-08-10 NOTE — Patient Instructions (Signed)
Good to see you Keep elbow down when sleeping Monitor at eye level Elbows at 90 degrees Keep working on posture on wall See me in 2 months

## 2021-10-07 NOTE — Progress Notes (Unsigned)
  [name] [address] [place] [address] Phone: [phone] Subjective:   Hector Evans, am serving as a scribe for Dr. Antoine Primas.  I'm seeing this patient by the request  of:  Etta Grandchild, MD  CC: Neck and back pain follow-up  [id number]  Hector Evans is a 36 y.o. male coming in with complaint of back and neck pain. OMT 08/10/2021. Patient states same per usual. Hasn't been doing at home exercises due to personal reasons. Having some sleep problems so would like recommendations on positioning when sleeping. No other complaints.  Medications patient has been prescribed: None  Taking:         No past medical history on file.  Allergies  Allergen Reactions   Amoxicillin      Review of Systems:  No headache, visual changes, nausea, vomiting, diarrhea, constipation, dizziness, abdominal pain, skin rash, fevers, chills, night sweats, weight loss, swollen lymph nodes, body aches, joint swelling, chest pain, shortness of breath, mood changes. POSITIVE muscle aches   Objective  Blood pressure 126/72, pulse 98, height 5\' 10"  (1.778 m), weight 176 lb (79.8 kg), SpO2 96 %.   General: No apparent distress alert and oriented x3 mood and affect normal, dressed appropriately.  HEENT: Pupils equal, extraocular movements intact  Respiratory: Patient's speak in full sentences and does not appear short of breath  Cardiovascular: No lower extremity edema, non tender, no erythema  Gait MSK:  Back has tightness noted in the thoracolumbar junction.  Still has some scapular dyskinesis noted.  Osteopathic findings  C2 flexed rotated and side bent right C6 flexed rotated and side bent left T3 extended rotated and side bent right inhaled rib T11 extended rotated and side bent left     Assessment and Plan:  Thoracic back pain Continues to have difficulty. Discussed the possibility of ergonomics and core strengthening.  Patient shown  other strengthening exercises that I think will be helpful.  Discussed icing regimen and home exercises otherwise.  No change in medication management.  Discussed that sleep still could be a possibility as well.  Follow-up again in 6 to 8 weeks.    Nonallopathic problems  Decision today to treat with OMT was based on Physical Exam  After verbal consent patient was treated with HVLA, ME, FPR techniques in cervical, rib, thoracic, lumbar areas  Patient tolerated the procedure well with improvement in symptoms  Patient given exercises, stretches and lifestyle modifications  See medications in patient instructions if given  Patient will follow up in 4-8 weeks     The above documentation has been reviewed and is accurate and complete , DO         Note: This dictation was prepared with Dragon dictation along with smaller phrase technology. Any transcriptional errors that result from this process are unintentional.

## 2021-10-10 ENCOUNTER — Ambulatory Visit (INDEPENDENT_AMBULATORY_CARE_PROVIDER_SITE_OTHER): Payer: No Typology Code available for payment source | Admitting: Family Medicine

## 2021-10-10 VITALS — BP 126/72 | HR 98 | Ht 70.0 in | Wt 176.0 lb

## 2021-10-10 DIAGNOSIS — M9904 Segmental and somatic dysfunction of sacral region: Secondary | ICD-10-CM | POA: Diagnosis not present

## 2021-10-10 DIAGNOSIS — M9903 Segmental and somatic dysfunction of lumbar region: Secondary | ICD-10-CM

## 2021-10-10 DIAGNOSIS — M546 Pain in thoracic spine: Secondary | ICD-10-CM

## 2021-10-10 DIAGNOSIS — M9902 Segmental and somatic dysfunction of thoracic region: Secondary | ICD-10-CM

## 2021-10-10 DIAGNOSIS — M9908 Segmental and somatic dysfunction of rib cage: Secondary | ICD-10-CM | POA: Diagnosis not present

## 2021-10-10 DIAGNOSIS — G8929 Other chronic pain: Secondary | ICD-10-CM

## 2021-10-10 NOTE — Assessment & Plan Note (Signed)
Continues to have difficulty. Discussed the possibility of ergonomics and core strengthening.  Patient shown other strengthening exercises that I think will be helpful.  Discussed icing regimen and home exercises otherwise.  No change in medication management.  Discussed that sleep still could be a possibility as well.  Follow-up again in 6 to 8 weeks.

## 2021-10-10 NOTE — Patient Instructions (Signed)
Good to see you! 2 tennis balls and a tube sock See you again in 2 months

## 2021-10-17 ENCOUNTER — Ambulatory Visit: Payer: No Typology Code available for payment source | Admitting: Internal Medicine

## 2022-01-09 NOTE — Progress Notes (Deleted)
  [place] Phone: [phone] Subjective:    I'm seeing this patient by the request  of:  Janith Lima, MD  CC:   [id number]  Hector Evans is a 36 y.o. male coming in with complaint of back and neck pain OMT 10/10/2021. Patient states   Medications patient has been prescribed: None  Taking:         Reviewed prior external information including notes and imaging from previsou exam, outside providers and external EMR if available.   As well as notes that were available from care everywhere and other healthcare systems.  Past medical history, social, surgical and family history all reviewed in electronic medical record.  No pertanent information unless stated regarding to the chief complaint.   No past medical history on file.  Allergies  Allergen Reactions   Amoxicillin      Review of Systems:  No headache, visual changes, nausea, vomiting, diarrhea, constipation, dizziness, abdominal pain, skin rash, fevers, chills, night sweats, weight loss, swollen lymph nodes, body aches, joint swelling, chest pain, shortness of breath, mood changes. POSITIVE muscle aches  Objective  There were no vitals taken for this visit.   General: No apparent distress alert and oriented x3 mood and affect normal, dressed appropriately.  HEENT: Pupils equal, extraocular movements intact  Respiratory: Patient's speak in full sentences and does not appear short of breath  Cardiovascular: No lower extremity edema, non tender, no erythema  Gait MSK:  Back   Osteopathic findings  C2 flexed rotated and side bent right C6 flexed rotated and side bent left T3 extended rotated and side bent right inhaled rib T9 extended rotated and side bent left L2 flexed rotated and side bent right Sacrum right on right       Assessment and Plan:  No problem-specific Assessment & Plan notes found for this encounter.     Nonallopathic problems  Decision today to treat with OMT was based on Physical Exam  After verbal consent patient was treated with HVLA, ME, FPR techniques in cervical, rib, thoracic, lumbar, and sacral  areas  Patient tolerated the procedure well with improvement in symptoms  Patient given exercises, stretches and lifestyle modifications  See medications in patient instructions if given  Patient will follow up in 4-8 weeks             Note: This dictation was prepared with Dragon dictation along with smaller phrase technology. Any transcriptional errors that result from this process are unintentional.

## 2022-01-16 ENCOUNTER — Ambulatory Visit: Payer: No Typology Code available for payment source | Admitting: Family Medicine

## 2022-10-25 DIAGNOSIS — G4733 Obstructive sleep apnea (adult) (pediatric): Secondary | ICD-10-CM | POA: Diagnosis not present

## 2022-11-25 DIAGNOSIS — G4733 Obstructive sleep apnea (adult) (pediatric): Secondary | ICD-10-CM | POA: Diagnosis not present

## 2022-12-05 DIAGNOSIS — F410 Panic disorder [episodic paroxysmal anxiety] without agoraphobia: Secondary | ICD-10-CM | POA: Diagnosis not present

## 2022-12-05 DIAGNOSIS — F4322 Adjustment disorder with anxiety: Secondary | ICD-10-CM | POA: Diagnosis not present

## 2022-12-06 ENCOUNTER — Encounter: Payer: Self-pay | Admitting: Internal Medicine

## 2022-12-06 NOTE — Progress Notes (Unsigned)
Subjective:    Patient ID: Hector Evans, male    DOB: April 20, 1985, 37 y.o.   MRN: [id number]      HPI Hector Evans is here for No chief complaint on file.    Anxiety-having severe anxiety related to a temporary hardship.    Medications and allergies reviewed with patient and updated if appropriate.  No current outpatient medications on file prior to visit.   No current facility-administered medications on file prior to visit.    Review of Systems     Objective:  There were no vitals filed for this visit. BP Readings from Last 3 Encounters:  10/10/21 126/72  08/10/21 112/76  06/20/21 110/74   Wt Readings from Last 3 Encounters:  10/10/21 176 lb (79.8 kg)  08/10/21 175 lb (79.4 kg)  06/20/21 170 lb (77.1 kg)   There is no height or weight on file to calculate BMI.    Physical Exam         Assessment & Plan:    See Problem List for Assessment and Plan of chronic medical problems.

## 2022-12-06 NOTE — Patient Instructions (Incomplete)
      Flu immunization administered today.      Medications changes include :   sertraline 50 mg daily, ativan 0.25-0.5 mg at bedtime if needed only.  Xanax 0.25 mg twice a day if needed only      Return in about 3 weeks (around 12/28/2022) for follow up.

## 2022-12-07 ENCOUNTER — Ambulatory Visit (INDEPENDENT_AMBULATORY_CARE_PROVIDER_SITE_OTHER): Payer: BC Managed Care – PPO | Admitting: Internal Medicine

## 2022-12-07 VITALS — BP 128/74 | HR 76 | Temp 98.5°F | Ht 70.0 in | Wt 160.1 lb

## 2022-12-07 DIAGNOSIS — Z23 Encounter for immunization: Secondary | ICD-10-CM

## 2022-12-07 DIAGNOSIS — F419 Anxiety disorder, unspecified: Secondary | ICD-10-CM | POA: Diagnosis not present

## 2022-12-07 MED ORDER — SERTRALINE HCL 50 MG PO TABS: 50.0000 mg | ORAL_TABLET | Freq: Every day | ORAL | 3 refills | Status: DC

## 2022-12-07 MED ORDER — ALPRAZOLAM 0.25 MG PO TABS: 0.2500 mg | ORAL_TABLET | Freq: Two times a day (BID) | ORAL | 0 refills | Status: DC | PRN

## 2022-12-07 MED ORDER — LORAZEPAM 0.5 MG PO TABS
0.2500 mg | ORAL_TABLET | Freq: Every evening | ORAL | 0 refills | Status: AC | PRN
Start: 2022-12-07 — End: ?

## 2022-12-07 NOTE — Assessment & Plan Note (Signed)
New Severe in nature Started 2 months ago - getting worse Got scammed and bought a house and has many problems and he can not afford Experiencing chest tightness, palpitations, decreased appetite, sleep issues and difficulty concentrating Start sertraline 50 mg daily - advised this will take 2-4 weeks to take effect Lorazepam 0.25 - 0.5 mg HS prn for sleep and alprazolam 0.25 mg bid prn during day - not at bedtime -- advised these meds are temporary and addicting.  Should not drink alcohol while taking this meds.  Should only take as needed - discussed other ways to try to help with anxiety Deferred therapist -- has good social support F/u in 3 weeks, sooner if needed

## 2022-12-11 ENCOUNTER — Telehealth: Payer: Self-pay | Admitting: Adult Health

## 2022-12-11 NOTE — Telephone Encounter (Signed)
Called pt 3x to inform of this appt but the message kept saying that the call could not be completed.

## 2022-12-11 NOTE — Telephone Encounter (Signed)
Pt said, CPAP machine is not working. Contacted DME; was told would be a month before could get a new machine. Will pay for the machine myself if I have to. Have schedule appt 01/02/23 with Dr. Vickey Huger to be able to discuss a new machine. Would like a call from the nurse.  Scheduled with Dr. Vickey Huger due to NP did not have an appt available.

## 2022-12-11 NOTE — Telephone Encounter (Signed)
Please call patient and offer appointment with Hector Millet NP for this Thursday 10/3 at 9:00 AM arrival 8:30 and then you can cancel the 10/22 appt with Dr Dohmeier.

## 2022-12-12 ENCOUNTER — Ambulatory Visit: Payer: BC Managed Care – PPO | Admitting: Internal Medicine

## 2022-12-13 NOTE — Progress Notes (Unsigned)
PATIENT: Hector Evans DOB: 04/02/1985  REASON FOR VISIT: follow up HISTORY FROM: patient PRIMARY NEUROLOGIST: Dr. Vickey Huger  HISTORY OF PRESENT ILLNESS: Today 12/13/22:  Hector Evans is a 37 y.o. male with a history of obstructive sleep apnea on CPAP. Returns today for follow-up.      05/11/21: Hector Evans is a 37 year old male with a history of obstructive sleep apnea on CPAP.  He returns today for his first CPAP download.  His sleep study showed that he had mild sleep apnea.  His download shows that he uses machine 27 out of 30 days for compliance of 90%.  He uses machine on average 7 hours and 48 minutes.  His residual AHI is 1.6 on 6 to 16 cm of water.  The patient states that he does not really like the CPAP.  Reports that the mask leaks when he lays on his back and side.  He would like to try the dental device.   REVIEW OF SYSTEMS: Out of a complete 14 system review of symptoms, the patient complains only of the following symptoms, and all other reviewed systems are negative.  FSS ESS  ALLERGIES: Allergies  Allergen Reactions   Amoxicillin     HOME MEDICATIONS: Outpatient Medications Prior to Visit  Medication Sig Dispense Refill   ALPRAZolam (XANAX) 0.25 MG tablet Take 1 tablet (0.25 mg total) by mouth 2 (two) times daily as needed for anxiety. Do not take at bedtime 30 tablet 0   LORazepam (ATIVAN) 0.5 MG tablet Take 0.5-1 tablets (0.25-0.5 mg total) by mouth at bedtime as needed for anxiety. 30 tablet 0   sertraline (ZOLOFT) 50 MG tablet Take 1 tablet (50 mg total) by mouth daily. 30 tablet 3   No facility-administered medications prior to visit.    PAST MEDICAL HISTORY: No past medical history on file.  PAST SURGICAL HISTORY: Past Surgical History:  Procedure Laterality Date   SKIN SURGERY     2022   WISDOM TOOTH EXTRACTION      FAMILY HISTORY: Family History  Problem Relation Age of Onset   Other Mother        brain tumor   Cancer Father         lymphoma   Heart disease Maternal Grandmother    Heart disease Maternal Grandfather    Heart disease Paternal Grandmother    Sleep apnea Neg Hx     SOCIAL HISTORY: Social History   Socioeconomic History   Marital status: Married    Spouse name: racheal   Number of children: 1   Years of education: Not on file   Highest education level: Bachelor's degree (e.g., BA, AB, BS)  Occupational History   Not on file  Tobacco Use   Smoking status: Never   Smokeless tobacco: Never  Substance and Sexual Activity   Alcohol use: Yes    Alcohol/week: 20.0 standard drinks of alcohol    Types: 20 Cans of beer per week   Drug use: Never   Sexual activity: Yes    Partners: Female    Birth control/protection: None  Other Topics Concern   Not on file  Social History Narrative   Lives with wife and son   Caffeine: 2 strong cups a day   Right handed   Social Determinants of Health   Financial Resource Strain: Not on file  Food Insecurity: Not on file  Transportation Needs: Not on file  Physical Activity: Not on file  Stress: Not on file  Social Connections:  Not on file  Intimate Partner Violence: Not on file      PHYSICAL EXAM  There were no vitals filed for this visit.  There is no height or weight on file to calculate BMI.  Generalized: Well developed, in no acute distress  Chest: Lungs clear to auscultation bilaterally  Neurological examination  Mentation: Alert oriented to time, place, history taking. Follows all commands speech and language fluent Cranial nerve II-XII: Extraocular movements were full, visual field were full on confrontational test Head turning and shoulder shrug  were normal and symmetric. Motor: The motor testing reveals 5 over 5 strength of all 4 extremities. Good symmetric motor tone is noted throughout.  Sensory: Sensory testing is intact to soft touch on all 4 extremities. No evidence of extinction is noted.  Gait and station: Gait is normal.     DIAGNOSTIC DATA (LABS, IMAGING, TESTING) - I reviewed patient records, labs, notes, testing and imaging myself where available.  Lab Results  Component Value Date   WBC 5.4 07/06/2020   HGB 16.1 07/06/2020   HCT 46.9 07/06/2020   MCV 88.0 07/06/2020   PLT 255.0 07/06/2020      Component Value Date/Time   NA 138 07/06/2020 0904   K 4.3 07/06/2020 0904   CL 101 07/06/2020 0904   CO2 30 07/06/2020 0904   GLUCOSE 89 07/06/2020 0904   BUN 11 07/06/2020 0904   CREATININE 0.86 07/06/2020 0904   CALCIUM 9.9 07/06/2020 0904   Lab Results  Component Value Date   CHOL 190 07/06/2020   HDL 40.60 07/06/2020   LDLCALC 130 (H) 07/06/2020   TRIG 96.0 07/06/2020   CHOLHDL 5 07/06/2020    Lab Results  Component Value Date   TSH 2.02 07/06/2020      ASSESSMENT AND PLAN 37 y.o. year old male  has no past medical history on file. here with:  OSA on CPAP  - CPAP compliance excellent - Good treatment of AHI  -Referral placed for dental device if this is not too costly then he will discontinue CPAP use.  Otherwise he will continue CPAP -Follow-up depends on if he stays on CPAP    Butch Penny, MSN, NP-C 12/13/2022, 5:11 PM [facility] [address] [place], [place] [address] [phone]

## 2022-12-14 ENCOUNTER — Ambulatory Visit (INDEPENDENT_AMBULATORY_CARE_PROVIDER_SITE_OTHER): Payer: No Typology Code available for payment source | Admitting: Adult Health

## 2022-12-14 ENCOUNTER — Encounter: Payer: Self-pay | Admitting: Adult Health

## 2022-12-14 VITALS — BP 117/77 | HR 78 | Ht 70.0 in | Wt 155.0 lb

## 2022-12-14 DIAGNOSIS — G4733 Obstructive sleep apnea (adult) (pediatric): Secondary | ICD-10-CM | POA: Diagnosis not present

## 2022-12-14 NOTE — Patient Instructions (Signed)
Continue using CPAP nightly and greater than 4 hours each night Take machine to DME company to be serviced If not fixable order has been placed for new machine If your symptoms worsen or you develop new symptoms please let us know.

## 2022-12-15 ENCOUNTER — Encounter: Payer: Self-pay | Admitting: Internal Medicine

## 2022-12-15 ENCOUNTER — Ambulatory Visit: Payer: BC Managed Care – PPO | Admitting: Internal Medicine

## 2022-12-15 DIAGNOSIS — F419 Anxiety disorder, unspecified: Secondary | ICD-10-CM

## 2022-12-17 ENCOUNTER — Encounter: Payer: Self-pay | Admitting: Adult Health

## 2022-12-19 MED ORDER — MIRTAZAPINE 15 MG PO TBDP: 15.0000 mg | ORAL_TABLET | Freq: Every day | ORAL | 2 refills | Status: DC

## 2022-12-19 MED ORDER — ALPRAZOLAM 0.25 MG PO TABS: 0.2500 mg | ORAL_TABLET | Freq: Two times a day (BID) | ORAL | 0 refills | Status: DC | PRN

## 2022-12-19 NOTE — Addendum Note (Signed)
Addended by: Pincus Sanes on: 12/19/2022 07:21 AM   Modules accepted: Orders

## 2022-12-19 NOTE — Addendum Note (Signed)
Addended by: Pincus Sanes on: 12/19/2022 12:47 PM   Modules accepted: Orders

## 2022-12-20 NOTE — Telephone Encounter (Signed)
Did send community message to [employer] this am about Pt settings

## 2022-12-25 DIAGNOSIS — G4733 Obstructive sleep apnea (adult) (pediatric): Secondary | ICD-10-CM | POA: Diagnosis not present

## 2023-01-02 ENCOUNTER — Ambulatory Visit: Payer: No Typology Code available for payment source | Admitting: Neurology

## 2023-01-02 DIAGNOSIS — F4323 Adjustment disorder with mixed anxiety and depressed mood: Secondary | ICD-10-CM | POA: Diagnosis not present

## 2023-01-10 ENCOUNTER — Other Ambulatory Visit: Payer: Self-pay | Admitting: Internal Medicine

## 2023-01-10 DIAGNOSIS — F4323 Adjustment disorder with mixed anxiety and depressed mood: Secondary | ICD-10-CM | POA: Diagnosis not present

## 2023-01-12 ENCOUNTER — Other Ambulatory Visit (HOSPITAL_COMMUNITY): Payer: Self-pay

## 2023-01-12 ENCOUNTER — Telehealth: Payer: Self-pay

## 2023-01-12 NOTE — Telephone Encounter (Signed)
Script updated and message sent to patient via my-chart regarding pick up.

## 2023-01-12 NOTE — Telephone Encounter (Signed)
Plain tablets okay.  Thank you.

## 2023-01-16 DIAGNOSIS — F4323 Adjustment disorder with mixed anxiety and depressed mood: Secondary | ICD-10-CM | POA: Diagnosis not present

## 2023-01-18 ENCOUNTER — Ambulatory Visit: Payer: BC Managed Care – PPO | Admitting: Internal Medicine

## 2023-01-23 DIAGNOSIS — G4733 Obstructive sleep apnea (adult) (pediatric): Secondary | ICD-10-CM | POA: Diagnosis not present

## 2023-01-25 DIAGNOSIS — F4323 Adjustment disorder with mixed anxiety and depressed mood: Secondary | ICD-10-CM | POA: Diagnosis not present

## 2023-02-05 DIAGNOSIS — F4323 Adjustment disorder with mixed anxiety and depressed mood: Secondary | ICD-10-CM | POA: Diagnosis not present

## 2023-02-07 ENCOUNTER — Encounter: Payer: Self-pay | Admitting: Internal Medicine

## 2023-02-07 ENCOUNTER — Ambulatory Visit: Payer: BC Managed Care – PPO | Admitting: Internal Medicine

## 2023-02-07 VITALS — BP 116/76 | HR 83 | Temp 97.5°F | Resp 16 | Ht 70.0 in | Wt 178.0 lb

## 2023-02-07 DIAGNOSIS — R635 Abnormal weight gain: Secondary | ICD-10-CM | POA: Diagnosis not present

## 2023-02-07 DIAGNOSIS — Z0001 Encounter for general adult medical examination with abnormal findings: Secondary | ICD-10-CM | POA: Diagnosis not present

## 2023-02-07 DIAGNOSIS — L219 Seborrheic dermatitis, unspecified: Secondary | ICD-10-CM | POA: Diagnosis not present

## 2023-02-07 DIAGNOSIS — F411 Generalized anxiety disorder: Secondary | ICD-10-CM

## 2023-02-07 LAB — BASIC METABOLIC PANEL
BUN: 8 mg/dL (ref 6–23)
CO2: 31 meq/L (ref 19–32)
Calcium: 10 mg/dL (ref 8.4–10.5)
Chloride: 100 meq/L (ref 96–112)
Creatinine, Ser: 0.92 mg/dL (ref 0.40–1.50)
GFR: 105.59 mL/min (ref >60.00)
Glucose, Bld: 99 mg/dL (ref 70–99)
Potassium: 4.8 meq/L (ref 3.5–5.1)
Sodium: 139 meq/L (ref 135–145)

## 2023-02-07 LAB — CBC WITH DIFFERENTIAL/PLATELET
Basophils Absolute: 0 10*3/uL (ref 0.0–0.1)
Basophils Relative: 0.4 % (ref 0.0–3.0)
Eosinophils Absolute: 0.1 10*3/uL (ref 0.0–0.7)
Eosinophils Relative: 1.1 % (ref 0.0–5.0)
HCT: 48 % (ref 39.0–52.0)
Hemoglobin: 16.4 g/dL (ref 13.0–17.0)
Lymphocytes Relative: 24.2 % (ref 12.0–46.0)
Lymphs Abs: 1.3 10*3/uL (ref 0.7–4.0)
MCHC: 34.1 g/dL (ref 30.0–36.0)
MCV: 90.4 fL (ref 78.0–100.0)
Monocytes Absolute: 0.5 10*3/uL (ref 0.1–1.0)
Monocytes Relative: 8.6 % (ref 3.0–12.0)
Neutro Abs: 3.6 10*3/uL (ref 1.4–7.7)
Neutrophils Relative %: 65.7 % (ref 43.0–77.0)
Platelets: 256 10*3/uL (ref 150.0–400.0)
RBC: 5.31 Mil/uL (ref 4.22–5.81)
RDW: 13.2 % (ref 11.5–15.5)
WBC: 5.5 10*3/uL (ref 4.0–10.5)

## 2023-02-07 LAB — HEPATIC FUNCTION PANEL
ALT: 32 U/L (ref 0–53)
AST: 16 U/L (ref 0–37)
Albumin: 4.7 g/dL (ref 3.5–5.2)
Alkaline Phosphatase: 56 U/L (ref 39–117)
Bilirubin, Direct: 0.2 mg/dL (ref 0.0–0.3)
Total Bilirubin: 0.7 mg/dL (ref 0.2–1.2)
Total Protein: 7.1 g/dL (ref 6.0–8.3)

## 2023-02-07 LAB — TSH: TSH: 1.22 u[IU]/mL (ref 0.35–5.50)

## 2023-02-07 MED ORDER — ITRACONAZOLE 100 MG PO CAPS: 200.0000 mg | ORAL_CAPSULE | Freq: Every day | ORAL | 0 refills | Status: AC

## 2023-02-07 NOTE — Progress Notes (Signed)
Subjective:  Patient ID: Hector Evans, male    DOB: 20-Dec-1985  Age: 37 y.o. MRN: [id number]  CC: Annual Exam and Rash   HPI Hector Evans presents for a CPX and f/up ----  Discussed the use of AI scribe software for clinical note transcription with the patient, who gave verbal consent to proceed.  History of Present Illness   The patient, with a history of anxiety and panic attacks, reports significant improvement in his mental health over the past few months. He had been experiencing daily panic attacks and significant stress, which led to the initiation of mirtazapine and intermittent use of Xanax and lorazepam. The patient has since discontinued Xanax and only uses lorazepam sparingly in situations of extreme stress. He reports a significant decrease in the frequency of panic attacks and a general improvement in his mental well-being.  The patient also mentions a recent fluctuation in weight due to stress, with a significant loss followed by rapid regain. He has noticed a decrease in heart rate on days when he forgets to take mirtazapine, but is unsure of the medication's overall impact on his health.  Additionally, the patient reports a rash on his face, which he attributes to poor self-care and dry skin. He has been applying baby ointment to manage the rash, which he reports as helpful.       Outpatient Medications Prior to Visit  Medication Sig Dispense Refill   LORazepam (ATIVAN) 0.5 MG tablet Take 0.5-1 tablets (0.25-0.5 mg total) by mouth at bedtime as needed for anxiety. 30 tablet 0   mirtazapine (REMERON SOL-TAB) 15 MG disintegrating tablet TAKE 1 TABLET BY MOUTH EVERYDAY AT BEDTIME 90 tablet 1   Multiple Vitamin (MULTIVITAMIN PO) Take by mouth.     ALPRAZolam (XANAX) 0.25 MG tablet Take 1 tablet (0.25 mg total) by mouth 2 (two) times daily as needed for anxiety. Do not take at bedtime 30 tablet 0   No facility-administered medications prior to visit.     ROS Review of Systems  Constitutional:  Positive for unexpected weight change (wt gain). Negative for appetite change, chills, diaphoresis, fatigue and fever.  HENT:  Negative for trouble swallowing.   Respiratory: Negative.  Negative for shortness of breath.   Cardiovascular:  Negative for chest pain, palpitations and leg swelling.  Gastrointestinal:  Negative for abdominal pain, constipation, diarrhea and vomiting.  Genitourinary: Negative.  Negative for difficulty urinating, dysuria, scrotal swelling, testicular pain and urgency.  Skin:  Positive for rash.       Asymptomatic rash for several months  Neurological: Negative.   Hematological:  Negative for adenopathy. Does not bruise/bleed easily.  Psychiatric/Behavioral:  Positive for dysphoric mood and sleep disturbance. Negative for behavioral problems, confusion, decreased concentration, self-injury and suicidal ideas. The patient is nervous/anxious.     Objective:  BP 116/76 (BP Location: Left Arm, Patient Position: Sitting, Cuff Size: Normal)   Pulse 83   Temp (!) 97.5 F (36.4 C) (Oral)   Resp 16   Ht 5\' 10"  (1.778 m)   Wt 178 lb (80.7 kg)   SpO2 96%   BMI 25.54 kg/m   BP Readings from Last 3 Encounters:  02/07/23 116/76  12/14/22 117/77  12/07/22 128/74    Wt Readings from Last 3 Encounters:  02/07/23 178 lb (80.7 kg)  12/14/22 155 lb (70.3 kg)  12/07/22 160 lb 2 oz (72.6 kg)    Physical Exam Vitals reviewed.  Constitutional:  Appearance: Normal appearance.  HENT:     Mouth/Throat:     Mouth: Mucous membranes are moist.  Eyes:     General: No scleral icterus.    Conjunctiva/sclera: Conjunctivae normal.  Cardiovascular:     Rate and Rhythm: Normal rate and regular rhythm.     Pulses: Normal pulses.     Heart sounds: No murmur heard.    No friction rub. No gallop.  Pulmonary:     Effort: Pulmonary effort is normal.     Breath sounds: No stridor. No wheezing, rhonchi or rales.  Abdominal:      General: Abdomen is flat.     Palpations: There is no mass.     Tenderness: There is no abdominal tenderness. There is no guarding.     Hernia: No hernia is present.  Musculoskeletal:     Cervical back: Neck supple.  Lymphadenopathy:     Cervical: No cervical adenopathy.  Skin:    Findings: Erythema and rash present.  Neurological:     General: No focal deficit present.     Mental Status: He is alert.  Psychiatric:        Attention and Perception: Attention and perception normal.        Mood and Affect: Mood is not anxious, depressed or elated. Affect is flat. Affect is not labile, blunt, angry, tearful or inappropriate.        Speech: Speech normal.        Behavior: Behavior normal.        Thought Content: Thought content normal. Thought content is not paranoid. Thought content does not include homicidal or suicidal ideation.        Cognition and Memory: Cognition normal.        Judgment: Judgment normal.     Lab Results  Component Value Date   WBC 5.5 02/07/2023   HGB 16.4 02/07/2023   HCT 48.0 02/07/2023   PLT 256.0 02/07/2023   GLUCOSE 99 02/07/2023   CHOL 190 07/06/2020   TRIG 96.0 07/06/2020   HDL 40.60 07/06/2020   LDLCALC 130 (H) 07/06/2020   ALT 32 02/07/2023   AST 16 02/07/2023   NA 139 02/07/2023   K 4.8 02/07/2023   CL 100 02/07/2023   CREATININE 0.92 02/07/2023   BUN 8 02/07/2023   CO2 31 02/07/2023   TSH 1.22 02/07/2023    Patient was never admitted.  Assessment & Plan:   Excessive weight gain- Labs are negative for secondary causes. -     Basic metabolic panel; Future -     CBC with Differential/Platelet; Future -     Hepatic function panel; Future -     TSH; Future  Seborrheic dermatitis- He has a extensive involvement.  Will treat with a 7-day course of systemic itraconazole. -     Basic metabolic panel; Future -     CBC with Differential/Platelet; Future -     Hepatic function panel; Future -     TSH; Future -     Itraconazole; Take 2  capsules (200 mg total) by mouth daily for 7 days.  Dispense: 14 capsule; Refill: 0  Encounter for general adult medical examination with abnormal findings- Exam completed, labs reviewed, vaccines reviewed, no cancer screenings indicated, pt ed material was given.   GAD (generalized anxiety disorder)- Labs are negative for secondary causes. -     Basic metabolic panel; Future -     CBC with Differential/Platelet; Future -     Hepatic function panel;  Future -     TSH; Future     Follow-up: Return in about 6 months (around 08/07/2023).  Sanda Linger, MD

## 2023-02-07 NOTE — Patient Instructions (Signed)
Seborrheic Dermatitis, Adult Seborrheic dermatitis is a skin disease that causes red, scaly patches. It often occurs on the scalp, where it may be called dandruff. The patches may also appear on other parts of the body. Skin patches tend to occur where there are a lot of oil glands in the skin. Areas of the body that may be affected include: The scalp. The face, eyebrows, and ears. The area around a beard. Skin folds of the body. This includes the armpits, groin, and buttocks. The chest. The condition is often long-lasting (chronic). It may come and go for no known reason. It may be activated by a trigger, such as: Cold weather. Being out in the sun. Stress. Drinking alcohol. What are the causes? The cause of this condition is not known. It may be related to having too much yeast on the skin or changes in how your body's disease-fighting system (immune system) works. What increases the risk? You may be more likely to develop this condition if: You have a weak immune system. You are 35 years old or older. You have other conditions, such as: Human immunodeficiency virus (HIV) or acquired immunodeficiency virus (AIDS). Parkinson's disease. Mood disorders, such as depression. Liver problems. Obesity. What are the signs or symptoms? Symptoms of this condition include: Thick scales on the scalp. Redness on the face or in the armpits. Skin that is flaky. The flakes may be white or yellow. Skin that seems oily or dry but is not helped with moisturizers. Itching or burning in the affected areas. How is this diagnosed? This condition is diagnosed with a medical history and physical exam. A sample of your skin may be tested (skin biopsy). You may need to see a skin specialist (dermatologist). How is this treated? There is no cure for this condition, but treatment can help to manage the symptoms. You may get treatment to remove scales, lower the risk of skin infection, and reduce swelling or  itching. Treatment may include: Medicated shampoos, moisturizing creams, or ointments. Creams that reduce skin yeast. Creams that reduce swelling and irritation (steroids). Follow these instructions at home: Skin care Use any medicated shampoo, skin creams, or ointments only as told by your health care provider. Do not use skin products that contain alcohol. Take lukewarm baths or showers. Avoid very hot water. When you are outside, wear a hat and clothes that block UV light. General instructions Apply over-the-counter and prescription medicines only as told by your health care provider. Learn what triggers your symptoms so you can avoid these things. Use techniques for stress reduction, such as meditation or yoga. Do not drink alcohol if your health care provider tells you not to drink. Keep all follow-up visits. Your health care provider will check your skin to make sure the treatments are helping. Where to find more information American Academy of Dermatology: [employer] Contact a health care provider if: Your symptoms do not get better with treatment. Your symptoms get worse. You have new symptoms. Get help right away if: Your condition quickly gets worse, even with treatment. This information is not intended to replace advice given to you by your health care provider. Make sure you discuss any questions you have with your health care provider. Document Revised: 07/29/2021 Document Reviewed: 07/29/2021 Elsevier Patient Education  2024 [employer].

## 2023-02-14 DIAGNOSIS — Z630 Problems in relationship with spouse or partner: Secondary | ICD-10-CM | POA: Diagnosis not present

## 2023-02-14 DIAGNOSIS — F4323 Adjustment disorder with mixed anxiety and depressed mood: Secondary | ICD-10-CM | POA: Diagnosis not present

## 2023-02-22 DIAGNOSIS — G4733 Obstructive sleep apnea (adult) (pediatric): Secondary | ICD-10-CM | POA: Diagnosis not present

## 2023-02-23 ENCOUNTER — Other Ambulatory Visit (HOSPITAL_COMMUNITY): Payer: Self-pay

## 2023-02-23 ENCOUNTER — Telehealth: Payer: Self-pay

## 2023-02-23 NOTE — Telephone Encounter (Signed)
*  Primary  Pharmacy Patient Advocate Encounter   Received notification from CoverMyMeds that prior authorization for Itraconazole 100MG  capsules  is required/requested.   Insurance verification completed.   The patient is insured through [facility] .   Per test claim: PA required; PA submitted to above mentioned insurance via CoverMyMeds Key/confirmation #/EOC [id number] Status is pending

## 2023-02-26 ENCOUNTER — Other Ambulatory Visit: Payer: Self-pay | Admitting: Internal Medicine

## 2023-02-26 DIAGNOSIS — F4323 Adjustment disorder with mixed anxiety and depressed mood: Secondary | ICD-10-CM | POA: Diagnosis not present

## 2023-02-26 DIAGNOSIS — Z630 Problems in relationship with spouse or partner: Secondary | ICD-10-CM | POA: Diagnosis not present

## 2023-02-26 DIAGNOSIS — L219 Seborrheic dermatitis, unspecified: Secondary | ICD-10-CM

## 2023-02-26 MED ORDER — ZORYVE 0.3 % EX FOAM: 1.0000 | Freq: Every day | CUTANEOUS | 1 refills | Status: AC

## 2023-02-26 NOTE — Telephone Encounter (Signed)
Would you like me to call and inform him of the denial ?

## 2023-02-26 NOTE — Telephone Encounter (Signed)
Patient is aware of the denial he said that he did pick the medication up and pay out of pocket so he will hold off on starting the new medication.

## 2023-02-26 NOTE — Telephone Encounter (Signed)
Pharmacy Patient Advocate Encounter  Received notification from [facility] that Prior Authorization for Itraconazole 100mg  caps has been DENIED.  Full denial letter will be uploaded to the media tab. See denial reason below.   PA #/Case ID/Reference #: [id number]

## 2023-03-15 DIAGNOSIS — Z630 Problems in relationship with spouse or partner: Secondary | ICD-10-CM | POA: Diagnosis not present

## 2023-03-15 DIAGNOSIS — F4323 Adjustment disorder with mixed anxiety and depressed mood: Secondary | ICD-10-CM | POA: Diagnosis not present

## 2023-03-25 DIAGNOSIS — G4733 Obstructive sleep apnea (adult) (pediatric): Secondary | ICD-10-CM | POA: Diagnosis not present

## 2023-03-29 DIAGNOSIS — F4323 Adjustment disorder with mixed anxiety and depressed mood: Secondary | ICD-10-CM | POA: Diagnosis not present

## 2023-03-29 DIAGNOSIS — Z630 Problems in relationship with spouse or partner: Secondary | ICD-10-CM | POA: Diagnosis not present

## 2023-04-11 DIAGNOSIS — F4323 Adjustment disorder with mixed anxiety and depressed mood: Secondary | ICD-10-CM | POA: Diagnosis not present

## 2023-04-11 DIAGNOSIS — Z630 Problems in relationship with spouse or partner: Secondary | ICD-10-CM | POA: Diagnosis not present

## 2023-04-22 ENCOUNTER — Encounter: Payer: Self-pay | Admitting: Internal Medicine

## 2023-04-25 DIAGNOSIS — F4323 Adjustment disorder with mixed anxiety and depressed mood: Secondary | ICD-10-CM | POA: Diagnosis not present

## 2023-04-25 DIAGNOSIS — Z630 Problems in relationship with spouse or partner: Secondary | ICD-10-CM | POA: Diagnosis not present

## 2023-05-08 DIAGNOSIS — F4323 Adjustment disorder with mixed anxiety and depressed mood: Secondary | ICD-10-CM | POA: Diagnosis not present

## 2023-05-08 DIAGNOSIS — Z630 Problems in relationship with spouse or partner: Secondary | ICD-10-CM | POA: Diagnosis not present

## 2023-05-22 DIAGNOSIS — F4323 Adjustment disorder with mixed anxiety and depressed mood: Secondary | ICD-10-CM | POA: Diagnosis not present

## 2023-05-22 DIAGNOSIS — Z630 Problems in relationship with spouse or partner: Secondary | ICD-10-CM | POA: Diagnosis not present

## 2023-06-08 DIAGNOSIS — G4733 Obstructive sleep apnea (adult) (pediatric): Secondary | ICD-10-CM | POA: Diagnosis not present

## 2023-07-22 ENCOUNTER — Other Ambulatory Visit: Payer: Self-pay | Admitting: Internal Medicine

## 2023-07-24 ENCOUNTER — Encounter: Payer: Self-pay | Admitting: Internal Medicine

## 2023-07-25 ENCOUNTER — Other Ambulatory Visit: Payer: Self-pay

## 2023-07-25 MED ORDER — MIRTAZAPINE 15 MG PO TBDP: 15.0000 mg | ORAL_TABLET | Freq: Every day | ORAL | 0 refills | Status: AC

## 2023-11-12 IMAGING — DX DG LUMBAR SPINE 2-3V
3 series · 3 of 3 positions shown · non-contrast
Comparison: None.

CLINICAL DATA: Lower thoracic and upper lumbar spine pain for the
past 3 years. No known injury.

EXAM:
LUMBAR SPINE - 2-3 VIEW

[l-spine ap]
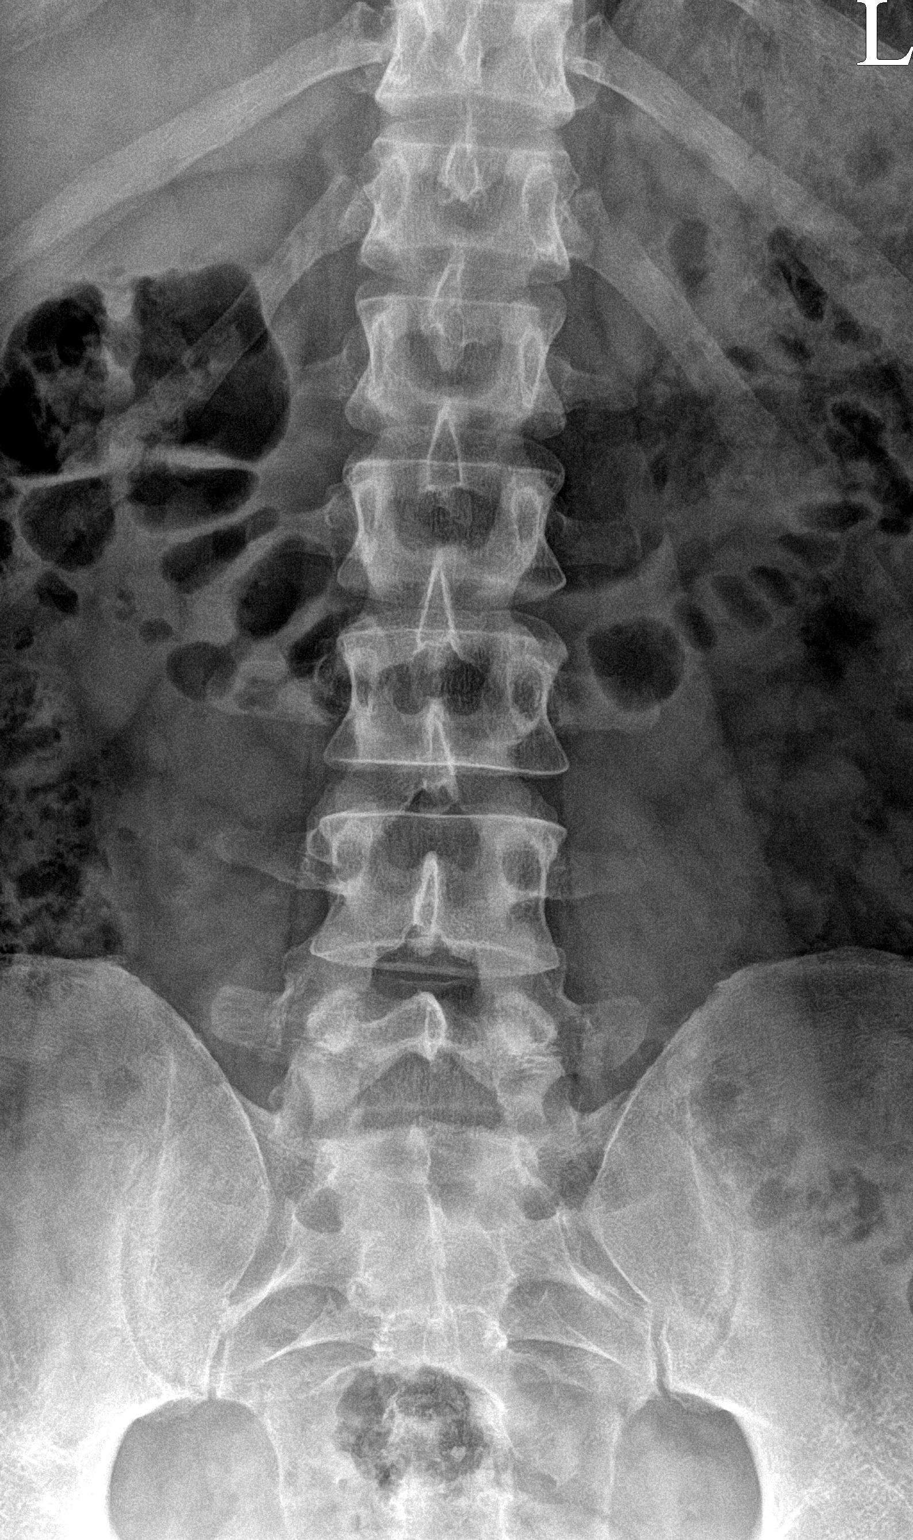

[l-spine lateral]
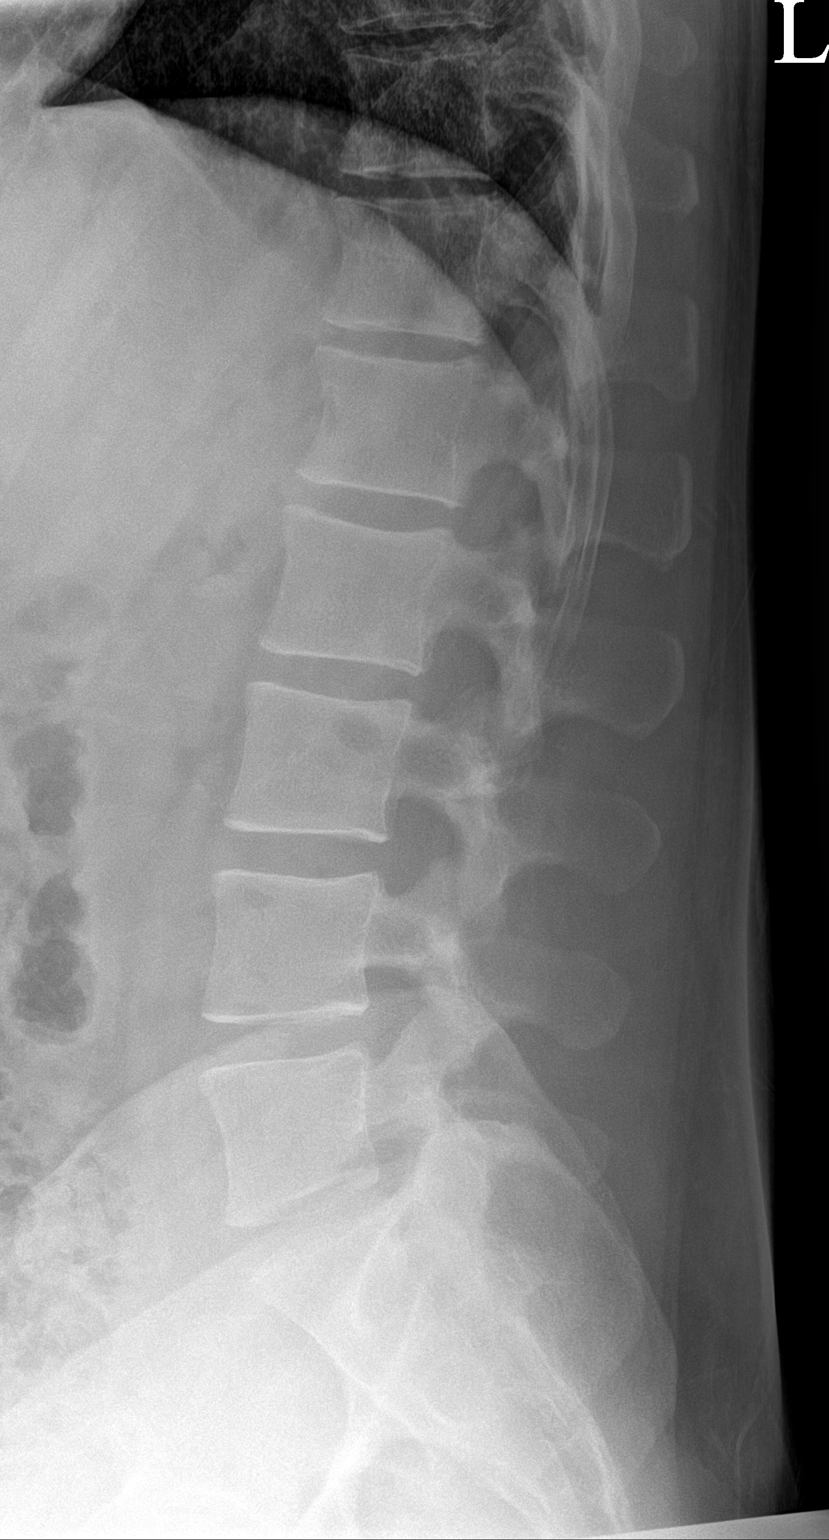

[l-spine spot]
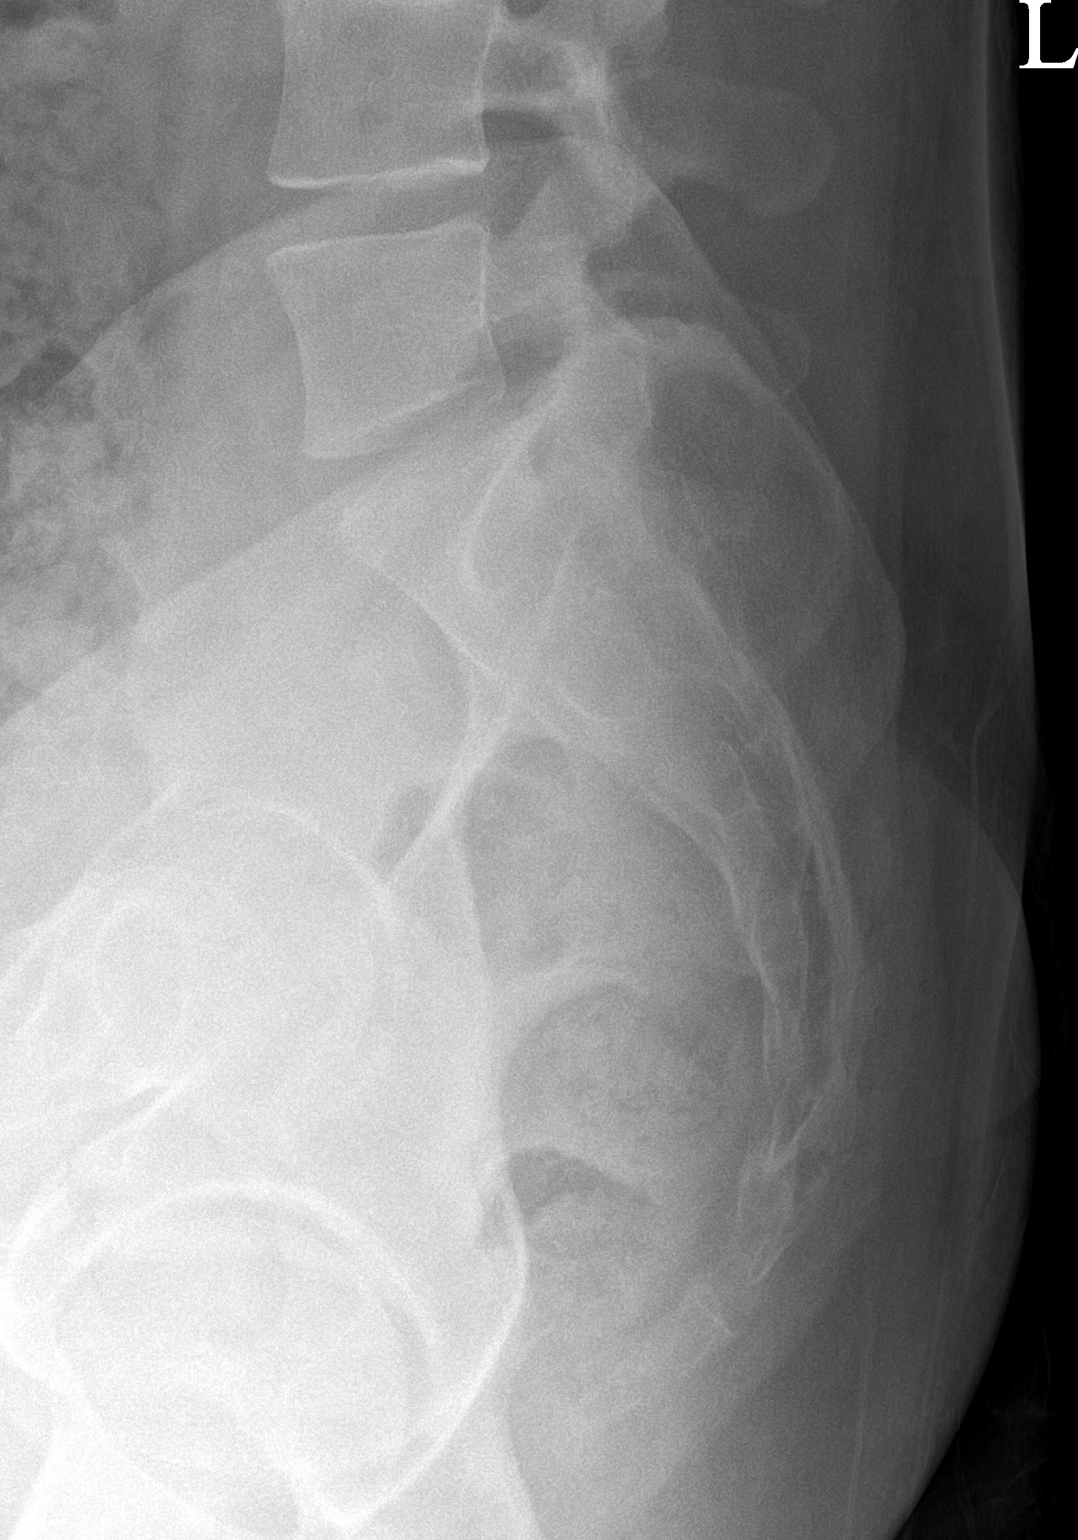

[3 of 3 positions shown; findings below may reference images not displayed]

FINDINGS: Five non-rib-bearing lumbar vertebrae. These have normal appearances
with no fractures, pars defects or subluxations.
IMPRESSION: Normal examination.

## 2023-11-12 IMAGING — DX DG THORACIC SPINE 2V
2 series · 2 of 2 positions shown · non-contrast
Comparison: None.

CLINICAL DATA: Back pain.

EXAM:
THORACIC SPINE 2 VIEWS

[t-spine ap]
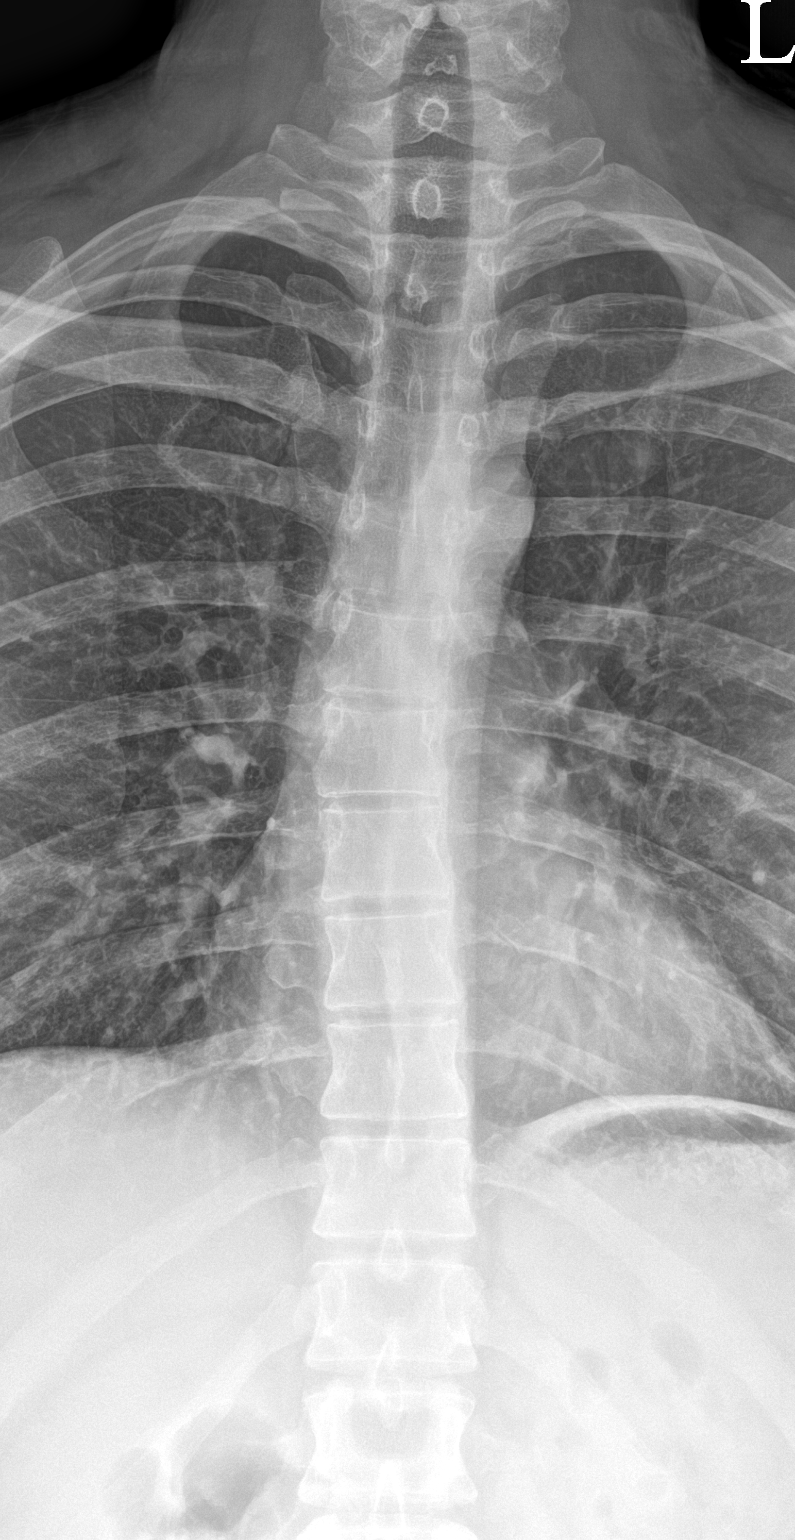

[t-spine lat]
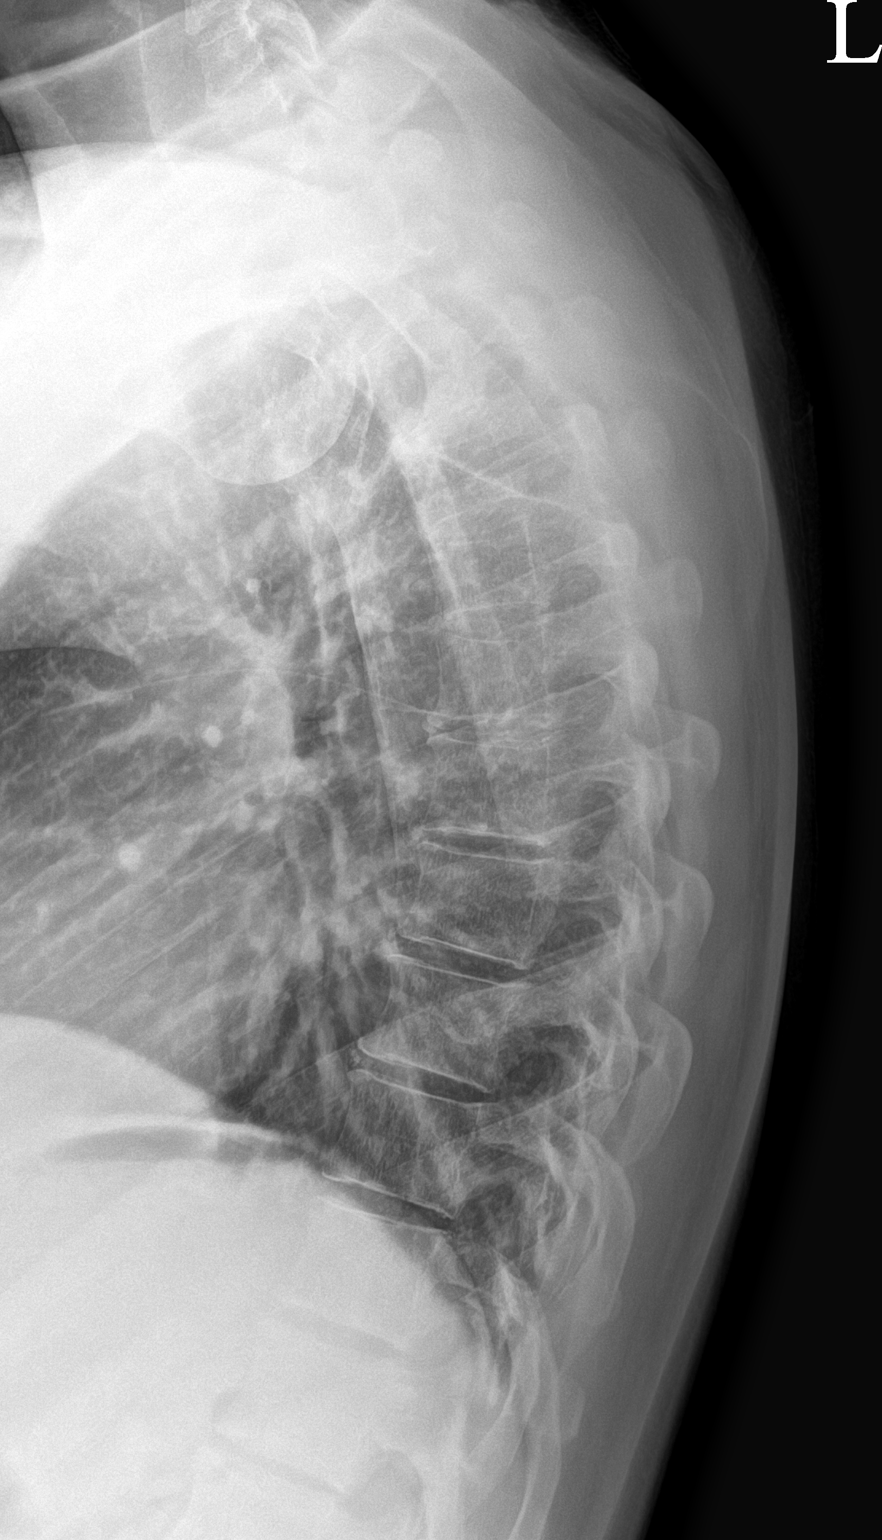

[2 of 2 positions shown; findings below may reference images not displayed]

FINDINGS: No acute fracture or subluxation of the thoracic spine. There is
minimal scoliosis. The vertebral body heights and disc spaces are
maintained. The soft tissues are unremarkable.
IMPRESSION: No acute/traumatic thoracic spine pathology.

## 2023-12-25 ENCOUNTER — Telehealth (INDEPENDENT_AMBULATORY_CARE_PROVIDER_SITE_OTHER): Payer: BC Managed Care – PPO | Admitting: Adult Health

## 2023-12-25 DIAGNOSIS — G4733 Obstructive sleep apnea (adult) (pediatric): Secondary | ICD-10-CM | POA: Diagnosis not present

## 2023-12-25 NOTE — Progress Notes (Signed)
 PATIENT: Hector Evans DOB: Aug 11, 1985  REASON FOR VISIT: follow up HISTORY FROM: patient Virtual Visit via Video Note  I connected with Hector Evans on 12/25/23 at  1:15 PM EDT by a video enabled telemedicine application located remotely at [facility] and verified that I am speaking with the correct person using two identifiers who was located at their own home.  Verified that he is currently located in [place]    I discussed the limitations of evaluation and management by telemedicine and the availability of in person appointments. The patient expressed understanding and agreed to proceed.   PATIENT: Hector Evans DOB: 07/11/1985  REASON FOR VISIT: follow up HISTORY FROM: patient  HISTORY OF PRESENT ILLNESS: Today 12/25/23  Hector Evans is a 38 y.o. male with a history of obstructive sleep apnea on CPAP. Returns today for follow-up.  He reports that the CPAP is working well for him.  He now has the new ResMed machine and states that that is better than his old machine.  Wears the nasal pillows for mask.  His download is below       HISTORY 12/13/22:   Hector Evans is a 38 y.o. male with a history of obstructive sleep apnea on CPAP. Returns today for follow-up.  His download indicates that he uses machine 27 out of 30 days.  He used it greater than 4 hours 23 days.  His residual AHI is 0.7 on 8.5 to 13 cm of water.  Patient states that his machine fell off his nightstand and is now not working appropriately.  He is not sure if it is fixable but would be willing to pay out-of-pocket for new machine.     05/11/21: Hector Evans is a 38 year old male with a history of obstructive sleep apnea on CPAP.  He returns today for his first CPAP download.  His sleep study showed that he had mild sleep apnea.  His download shows that he uses machine 27 out of 30 days for compliance of 90%.  He uses machine on average 7 hours and 48 minutes.  His residual AHI is 1.6 on 6 to  16 cm of water.  The patient states that he does not really like the CPAP.  Reports that the mask leaks when he lays on his back and side.  He would like to try the dental device.    REVIEW OF SYSTEMS: Out of a complete 14 system review of symptoms, the patient complains only of the following symptoms, and all other reviewed systems are negative.  ALLERGIES: Allergies  Allergen Reactions   Amoxicillin     HOME MEDICATIONS: Outpatient Medications Prior to Visit  Medication Sig Dispense Refill   LORazepam  (ATIVAN ) 0.5 MG tablet Take 0.5-1 tablets (0.25-0.5 mg total) by mouth at bedtime as needed for anxiety. 30 tablet 0   mirtazapine  (REMERON  SOL-TAB) 15 MG disintegrating tablet Take 1 tablet (15 mg total) by mouth at bedtime. 90 tablet 0   Multiple Vitamin (MULTIVITAMIN PO) Take by mouth.     Roflumilast , Antiseborrheic, (ZORYVE ) 0.3 % FOAM Apply 1 Act topically daily. 60 g 1   No facility-administered medications prior to visit.    PAST MEDICAL HISTORY: No past medical history on file.  PAST SURGICAL HISTORY: Past Surgical History:  Procedure Laterality Date   SKIN SURGERY     2022   WISDOM TOOTH EXTRACTION      FAMILY HISTORY: Family History  Problem Relation Age of Onset   Other Mother  brain tumor   Cancer Father        lymphoma   Heart disease Maternal Grandmother    Heart disease Maternal Grandfather    Heart disease Paternal Grandmother    Sleep apnea Neg Hx     SOCIAL HISTORY: Social History   Socioeconomic History   Marital status: Married    Spouse name: racheal   Number of children: 1   Years of education: Not on file   Highest education level: Bachelor's degree (e.g., BA, AB, BS)  Occupational History   Not on file  Tobacco Use   Smoking status: Never   Smokeless tobacco: Never  Substance and Sexual Activity   Alcohol use: Not Currently   Drug use: Never   Sexual activity: Yes    Partners: Female    Birth control/protection: None   Other Topics Concern   Not on file  Social History Narrative   Lives with wife and son   Caffeine: 2 strong cups a day   Right handed   Social Drivers of Health   Financial Resource Strain: Medium Risk (02/03/2023)   Overall Financial Resource Strain (CARDIA)    Difficulty of Paying Living Expenses: Somewhat hard  Food Insecurity: No Food Insecurity (02/03/2023)   Hunger Vital Sign    Worried About Running Out of Food in the Last Year: Never true    Ran Out of Food in the Last Year: Never true  Transportation Needs: No Transportation Needs (02/03/2023)   PRAPARE - Administrator, Civil Service (Medical): No    Lack of Transportation (Non-Medical): No  Physical Activity: Unknown (02/03/2023)   Exercise Vital Sign    Days of Exercise per Week: 0 days    Minutes of Exercise per Session: Not on file  Stress: Stress Concern Present (02/03/2023)   [employer] of Occupational Health - Occupational Stress Questionnaire    Feeling of Stress : Very much  Social Connections: Moderately Isolated (02/03/2023)   Social Connection and Isolation Panel    Frequency of Communication with Friends and Family: More than three times a week    Frequency of Social Gatherings with Friends and Family: Never    Attends Religious Services: Never    Database administrator or Organizations: No    Attends Engineer, structural: Not on file    Marital Status: Married  Catering manager Violence: Not on file      PHYSICAL EXAM Generalized: Well developed, in no acute distress   Neurological examination  Mentation: Alert oriented to time, place, history taking. Follows all commands speech and language fluent Cranial nerve II-XII: Facial symmetry noted.   DIAGNOSTIC DATA (LABS, IMAGING, TESTING) - I reviewed patient records, labs, notes, testing and imaging myself where available.  Lab Results  Component Value Date   WBC 5.5 02/07/2023   HGB 16.4 02/07/2023   HCT 48.0  02/07/2023   MCV 90.4 02/07/2023   PLT 256.0 02/07/2023      Component Value Date/Time   NA 139 02/07/2023 0841   K 4.8 02/07/2023 0841   CL 100 02/07/2023 0841   CO2 31 02/07/2023 0841   GLUCOSE 99 02/07/2023 0841   BUN 8 02/07/2023 0841   CREATININE 0.92 02/07/2023 0841   CALCIUM 10.0 02/07/2023 0841   PROT 7.1 02/07/2023 0841   ALBUMIN 4.7 02/07/2023 0841   AST 16 02/07/2023 0841   ALT 32 02/07/2023 0841   ALKPHOS 56 02/07/2023 0841   BILITOT 0.7 02/07/2023 0841  Lab Results  Component Value Date   CHOL 190 07/06/2020   HDL 40.60 07/06/2020   LDLCALC 130 (H) 07/06/2020   TRIG 96.0 07/06/2020   CHOLHDL 5 07/06/2020   No results found for: HGBA1C No results found for: VITAMINB12 Lab Results  Component Value Date   TSH 1.22 02/07/2023      ASSESSMENT AND PLAN 38 y.o. year old male  has no past medical history on file. here with:  OSA on CPAP  CPAP compliance excellent Residual AHI is good Encouraged patient to continue using CPAP nightly and > 4 hours each night F/U in 1 year or sooner if needed  Duwaine Russell, MSN, NP-C 12/25/2023, 12:58 PM [facility] [address] [place], [place] [address] [phone]  The patient's condition requires frequent monitoring and adjustments in the treatment plan, reflecting the ongoing complexity of care.  This provider is the continuing focal point for all needed services for this condition.

## 2024-01-21 ENCOUNTER — Encounter: Payer: Self-pay | Admitting: Internal Medicine

## 2024-03-10 ENCOUNTER — Telehealth: Payer: Self-pay | Admitting: Adult Health

## 2024-03-10 NOTE — Telephone Encounter (Signed)
 LVM and sent MyChart Message informing pt of needed reschedule 12/25/24 - NP OUT

## 2024-12-25 ENCOUNTER — Telehealth: Admitting: Adult Health

## 2024-12-30 ENCOUNTER — Telehealth: Admitting: Adult Health
